# Patient Record
Sex: Female | Born: 1950 | Race: White | Hispanic: No | Marital: Married | Smoking: Never smoker
Health system: Southern US, Community
[De-identification: ages and names within clinical notes are randomized; demographics above are authoritative.]

## PROBLEM LIST (undated history)

## (undated) DIAGNOSIS — M1990 Unspecified osteoarthritis, unspecified site: Secondary | ICD-10-CM

## (undated) DIAGNOSIS — S82891A Other fracture of right lower leg, initial encounter for closed fracture: Secondary | ICD-10-CM

## (undated) DIAGNOSIS — R569 Unspecified convulsions: Secondary | ICD-10-CM

## (undated) DIAGNOSIS — M1711 Unilateral primary osteoarthritis, right knee: Secondary | ICD-10-CM

## (undated) DIAGNOSIS — N6019 Diffuse cystic mastopathy of unspecified breast: Secondary | ICD-10-CM

## (undated) DIAGNOSIS — Z8619 Personal history of other infectious and parasitic diseases: Secondary | ICD-10-CM

## (undated) DIAGNOSIS — S82892A Other fracture of left lower leg, initial encounter for closed fracture: Secondary | ICD-10-CM

## (undated) HISTORY — DX: Unspecified osteoarthritis, unspecified site: M19.90

## (undated) HISTORY — DX: Personal history of other infectious and parasitic diseases: Z86.19

## (undated) HISTORY — PX: DILATION AND CURETTAGE OF UTERUS: SHX78

## (undated) HISTORY — DX: Unilateral primary osteoarthritis, right knee: M17.11

## (undated) HISTORY — DX: Diffuse cystic mastopathy of unspecified breast: N60.19

## (undated) HISTORY — DX: Other fracture of left lower leg, initial encounter for closed fracture: S82.892A

## (undated) HISTORY — DX: Other fracture of right lower leg, initial encounter for closed fracture: S82.891A

---

## 2001-07-27 LAB — HM COLONOSCOPY

## 2003-12-18 ENCOUNTER — Encounter: Payer: Self-pay | Admitting: Physical Medicine and Rehabilitation

## 2004-08-24 ENCOUNTER — Ambulatory Visit: Payer: Self-pay | Admitting: Obstetrics and Gynecology

## 2005-05-22 ENCOUNTER — Ambulatory Visit: Payer: Self-pay | Admitting: Unknown Physician Specialty

## 2005-10-09 ENCOUNTER — Ambulatory Visit: Payer: Self-pay | Admitting: Obstetrics and Gynecology

## 2006-10-30 ENCOUNTER — Ambulatory Visit: Payer: Self-pay | Admitting: Obstetrics and Gynecology

## 2007-01-29 ENCOUNTER — Ambulatory Visit: Payer: Self-pay | Admitting: Obstetrics and Gynecology

## 2007-10-21 ENCOUNTER — Ambulatory Visit: Payer: Self-pay | Admitting: Obstetrics and Gynecology

## 2007-10-21 ENCOUNTER — Other Ambulatory Visit: Payer: Self-pay

## 2007-11-04 ENCOUNTER — Ambulatory Visit: Payer: Self-pay | Admitting: Obstetrics and Gynecology

## 2009-01-11 ENCOUNTER — Other Ambulatory Visit: Payer: Self-pay | Admitting: Obstetrics and Gynecology

## 2009-01-27 ENCOUNTER — Ambulatory Visit: Payer: Self-pay

## 2009-05-26 ENCOUNTER — Other Ambulatory Visit: Payer: Self-pay | Admitting: Obstetrics and Gynecology

## 2010-02-27 ENCOUNTER — Ambulatory Visit: Payer: Self-pay

## 2010-03-01 ENCOUNTER — Ambulatory Visit: Payer: Self-pay

## 2010-10-10 ENCOUNTER — Ambulatory Visit: Payer: Self-pay | Admitting: Family Medicine

## 2011-04-26 ENCOUNTER — Ambulatory Visit: Payer: Self-pay | Admitting: Family Medicine

## 2012-07-27 LAB — HM PAP SMEAR: HM Pap smear: NORMAL

## 2012-07-27 LAB — HM MAMMOGRAPHY: HM Mammogram: NORMAL

## 2012-07-28 ENCOUNTER — Ambulatory Visit: Payer: Self-pay | Admitting: Family Medicine

## 2012-07-30 ENCOUNTER — Ambulatory Visit: Payer: Self-pay | Admitting: Family Medicine

## 2012-07-31 ENCOUNTER — Ambulatory Visit: Payer: Self-pay | Admitting: Family Medicine

## 2013-07-07 ENCOUNTER — Ambulatory Visit: Payer: Self-pay | Admitting: Internal Medicine

## 2013-07-27 ENCOUNTER — Encounter (INDEPENDENT_AMBULATORY_CARE_PROVIDER_SITE_OTHER): Payer: Self-pay

## 2013-07-27 ENCOUNTER — Encounter: Payer: Self-pay | Admitting: Internal Medicine

## 2013-07-27 ENCOUNTER — Ambulatory Visit (INDEPENDENT_AMBULATORY_CARE_PROVIDER_SITE_OTHER): Payer: Medicare PPO | Admitting: Internal Medicine

## 2013-07-27 VITALS — BP 122/88 | HR 69 | Temp 98.4°F | Ht 65.5 in | Wt 179.0 lb

## 2013-07-27 DIAGNOSIS — M949 Disorder of cartilage, unspecified: Secondary | ICD-10-CM

## 2013-07-27 DIAGNOSIS — Z1211 Encounter for screening for malignant neoplasm of colon: Secondary | ICD-10-CM

## 2013-07-27 DIAGNOSIS — M8580 Other specified disorders of bone density and structure, unspecified site: Secondary | ICD-10-CM

## 2013-07-27 DIAGNOSIS — Z1239 Encounter for other screening for malignant neoplasm of breast: Secondary | ICD-10-CM | POA: Insufficient documentation

## 2013-07-27 DIAGNOSIS — M899 Disorder of bone, unspecified: Secondary | ICD-10-CM

## 2013-07-27 DIAGNOSIS — Z0000 Encounter for general adult medical examination without abnormal findings: Secondary | ICD-10-CM

## 2013-07-27 DIAGNOSIS — Z136 Encounter for screening for cardiovascular disorders: Secondary | ICD-10-CM

## 2013-07-27 LAB — COMPREHENSIVE METABOLIC PANEL
ALT: 24 U/L (ref 0–35)
AST: 28 U/L (ref 0–37)
Albumin: 4.3 g/dL (ref 3.5–5.2)
Alkaline Phosphatase: 72 U/L (ref 39–117)
BUN: 11 mg/dL (ref 6–23)
CALCIUM: 9.7 mg/dL (ref 8.4–10.5)
CHLORIDE: 108 meq/L (ref 96–112)
CO2: 27 mEq/L (ref 19–32)
CREATININE: 0.8 mg/dL (ref 0.4–1.2)
GFR: 79.93 mL/min (ref >60.00)
GLUCOSE: 91 mg/dL (ref 70–99)
POTASSIUM: 4.3 meq/L (ref 3.5–5.1)
Sodium: 142 mEq/L (ref 135–145)
Total Bilirubin: 1.8 mg/dL — ABNORMAL HIGH (ref 0.2–1.2)
Total Protein: 6.9 g/dL (ref 6.0–8.3)

## 2013-07-27 LAB — CBC WITH DIFFERENTIAL/PLATELET
BASOS PCT: 0.3 % (ref 0.0–3.0)
Basophils Absolute: 0 10*3/uL (ref 0.0–0.1)
EOS ABS: 0.1 10*3/uL (ref 0.0–0.7)
EOS PCT: 1.4 % (ref 0.0–5.0)
HCT: 42.7 % (ref 36.0–46.0)
HEMOGLOBIN: 14.4 g/dL (ref 12.0–15.0)
Lymphocytes Relative: 24.3 % (ref 12.0–46.0)
Lymphs Abs: 1.4 10*3/uL (ref 0.7–4.0)
MCHC: 33.7 g/dL (ref 30.0–36.0)
MCV: 90.2 fl (ref 78.0–100.0)
Monocytes Absolute: 0.4 10*3/uL (ref 0.1–1.0)
Monocytes Relative: 6.4 % (ref 3.0–12.0)
NEUTROS ABS: 3.8 10*3/uL (ref 1.4–7.7)
NEUTROS PCT: 67.6 % (ref 43.0–77.0)
Platelets: 215 10*3/uL (ref 150.0–400.0)
RBC: 4.74 Mil/uL (ref 3.87–5.11)
RDW: 13.2 % (ref 11.5–15.5)
WBC: 5.7 10*3/uL (ref 4.0–10.5)

## 2013-07-27 LAB — MICROALBUMIN / CREATININE URINE RATIO
Creatinine,U: 111.2 mg/dL
MICROALB/CREAT RATIO: 0.4 mg/g (ref 0.0–30.0)
Microalb, Ur: 0.5 mg/dL (ref 0.0–1.9)

## 2013-07-27 LAB — LIPID PANEL
CHOLESTEROL: 196 mg/dL (ref 0–200)
HDL: 49.6 mg/dL (ref >39.00)
LDL Cholesterol: 117 mg/dL — ABNORMAL HIGH (ref 0–99)
TRIGLYCERIDES: 146 mg/dL (ref 0.0–149.0)
Total CHOL/HDL Ratio: 4
VLDL: 29.2 mg/dL (ref 0.0–40.0)

## 2013-07-27 NOTE — Assessment & Plan Note (Signed)
Pt reports h/o osteopenia. Will check Vit D with labs today. Will set up follow up bone density testing. Discussed importance of adequate dietary calcium intake. Discussed importance of adequate weight bearing exercise.

## 2013-07-27 NOTE — Assessment & Plan Note (Signed)
Will set up mammogram. 

## 2013-07-27 NOTE — Progress Notes (Signed)
Subjective:    Patient ID: Samantha Ellison, female    DOB: December 26, 1950, 63 y.o.   MRN: [id number]  HPI 63YO female presents to establish care. Doing well. Notes some recent issues with right hip pain, however this has completely resolved with chiropractic care. Otherwise, feeling well. Due for mammogram and colonoscopy. Tries to follow healthy diet and exercises regularly.  Review of Systems  Constitutional: Negative for fever, chills, appetite change, fatigue and unexpected weight change.  HENT: Negative for congestion, ear pain, sinus pressure, sore throat, trouble swallowing and voice change.   Eyes: Negative for visual disturbance.  Respiratory: Negative for cough, shortness of breath, wheezing and stridor.   Cardiovascular: Negative for chest pain, palpitations and leg swelling.  Gastrointestinal: Negative for nausea, vomiting, abdominal pain, diarrhea, constipation, blood in stool, abdominal distention and anal bleeding.  Genitourinary: Negative for dysuria and flank pain.  Musculoskeletal: Negative for arthralgias, gait problem, myalgias and neck pain.  Skin: Negative for color change and rash.  Neurological: Negative for dizziness and headaches.  Hematological: Negative for adenopathy. Does not bruise/bleed easily.  Psychiatric/Behavioral: Negative for suicidal ideas, sleep disturbance and dysphoric mood. The patient is not nervous/anxious.        Objective:    BP 122/88  Pulse 69  Temp(Src) 98.4 F (36.9 C) (Oral)  Ht 5' 5.5" (1.664 m)  Wt 179 lb (81.194 kg)  BMI 29.32 kg/m2  SpO2 97% Physical Exam  Constitutional: She is oriented to person, place, and time. She appears well-developed and well-nourished. No distress.  HENT:  Head: Normocephalic and atraumatic.  Right Ear: External ear normal.  Left Ear: External ear normal.  Nose: Nose normal.  Mouth/Throat: Oropharynx is clear and moist. No oropharyngeal exudate.  Eyes: Conjunctivae are normal. Pupils are equal,  round, and reactive to light. Right eye exhibits no discharge. Left eye exhibits no discharge. No scleral icterus.  Neck: Normal range of motion. Neck supple. No tracheal deviation present. No thyromegaly present.  Cardiovascular: Normal rate, regular rhythm, normal heart sounds and intact distal pulses.  Exam reveals no gallop and no friction rub.   No murmur heard. Pulmonary/Chest: Effort normal and breath sounds normal. No accessory muscle usage. Not tachypneic. No respiratory distress. She has no decreased breath sounds. She has no wheezes. She has no rhonchi. She has no rales. She exhibits no tenderness.  Abdominal: Soft. Bowel sounds are normal. She exhibits no distension. There is no tenderness.  Musculoskeletal: Normal range of motion. She exhibits no edema and no tenderness.  Lymphadenopathy:    She has no cervical adenopathy.  Neurological: She is alert and oriented to person, place, and time. No cranial nerve deficit. She exhibits normal muscle tone. Coordination normal.  Skin: Skin is warm and dry. No rash noted. She is not diaphoretic. No erythema. No pallor.  Psychiatric: She has a normal mood and affect. Her behavior is normal. Judgment and thought content normal.          Assessment & Plan:   Problem List Items Addressed This Visit   Osteopenia - Primary     Pt reports h/o osteopenia. Will check Vit D with labs today. Will set up follow up bone density testing. Discussed importance of adequate dietary calcium intake. Discussed importance of adequate weight bearing exercise.    Relevant Orders      DG Bone Density      Vit D  25 hydroxy (rtn osteoporosis monitoring)   Screening for breast cancer     Will set  up mammogram.    Relevant Orders      MM Digital Screening   Special screening for malignant neoplasms, colon     Will set up colonoscopy.    Relevant Orders      Ambulatory referral to Gastroenterology      CBC with Differential      Comprehensive metabolic  panel      Lipid panel      Microalbumin / creatinine urine ratio    Other Visit Diagnoses   Routine general medical examination at a health care facility            Return in about 4 weeks (around 08/24/2013) for Recheck of Blood Pressure, Physical.

## 2013-07-27 NOTE — Progress Notes (Signed)
Pre visit review using our clinic review tool, if applicable. No additional management support is needed unless otherwise documented below in the visit note. 

## 2013-07-27 NOTE — Assessment & Plan Note (Signed)
Will set up colonoscopy. 

## 2013-07-28 ENCOUNTER — Encounter: Payer: Self-pay | Admitting: Internal Medicine

## 2013-07-28 LAB — VITAMIN D 25 HYDROXY (VIT D DEFICIENCY, FRACTURES): Vit D, 25-Hydroxy: 64 ng/mL (ref 30–89)

## 2013-07-28 NOTE — Progress Notes (Signed)
Advised pt and requested labs from previous physician

## 2013-08-07 ENCOUNTER — Telehealth: Payer: Self-pay | Admitting: Internal Medicine

## 2013-08-27 ENCOUNTER — Encounter: Payer: Self-pay | Admitting: Internal Medicine

## 2013-08-27 ENCOUNTER — Ambulatory Visit (INDEPENDENT_AMBULATORY_CARE_PROVIDER_SITE_OTHER): Payer: Medicare PPO | Admitting: Internal Medicine

## 2013-08-27 VITALS — BP 126/84 | HR 75 | Temp 98.2°F | Ht 65.5 in | Wt 182.5 lb

## 2013-08-27 DIAGNOSIS — R17 Unspecified jaundice: Secondary | ICD-10-CM

## 2013-08-27 DIAGNOSIS — Z23 Encounter for immunization: Secondary | ICD-10-CM

## 2013-08-27 DIAGNOSIS — M25559 Pain in unspecified hip: Secondary | ICD-10-CM

## 2013-08-27 DIAGNOSIS — F411 Generalized anxiety disorder: Secondary | ICD-10-CM | POA: Insufficient documentation

## 2013-08-27 DIAGNOSIS — M25551 Pain in right hip: Secondary | ICD-10-CM

## 2013-08-27 DIAGNOSIS — Z0000 Encounter for general adult medical examination without abnormal findings: Secondary | ICD-10-CM | POA: Insufficient documentation

## 2013-08-27 LAB — COMPREHENSIVE METABOLIC PANEL
ALBUMIN: 4.2 g/dL (ref 3.5–5.2)
ALT: 19 U/L (ref 0–35)
AST: 14 U/L (ref 0–37)
Alkaline Phosphatase: 80 U/L (ref 39–117)
BUN: 20 mg/dL (ref 6–23)
CHLORIDE: 106 meq/L (ref 96–112)
CO2: 27 mEq/L (ref 19–32)
CREATININE: 0.7 mg/dL (ref 0.4–1.2)
Calcium: 10 mg/dL (ref 8.4–10.5)
GFR: 86.35 mL/min (ref >60.00)
Glucose, Bld: 78 mg/dL (ref 70–99)
Potassium: 4 mEq/L (ref 3.5–5.1)
SODIUM: 140 meq/L (ref 135–145)
Total Bilirubin: 1.3 mg/dL — ABNORMAL HIGH (ref 0.2–1.2)
Total Protein: 7 g/dL (ref 6.0–8.3)

## 2013-08-27 LAB — HM COLONOSCOPY

## 2013-08-27 MED ORDER — ALPRAZOLAM 0.25 MG PO TABS: 0.2500 mg | ORAL_TABLET | Freq: Every day | ORAL | Status: DC | PRN

## 2013-08-27 NOTE — Progress Notes (Signed)
The patient is here for annual Medicare Wellness Examination and management of other chronic and acute problems.   The risk factors are reflected in the history.  The roster of all physicians providing medical care to patient - is listed in the Snapshot section of the chart.  Activities of daily living:   The patient is 100% independent in all ADLs: dressing, toileting, feeding as well as independent mobility. Patient lives in a free a two story home with husband and 2 cats.  Home safety :  The patient has smoke detectors in the home.  They wear seatbelts in their car. There are no firearms at home.  There is no violence in the home. They feel safe where they live.  Infectious Risks: There is no risks for hepatitis, STDs or HIV.  There is no  history of blood transfusion.  They have no travel history to infectious disease endemic areas of the world.  Additional Health Care Providers: The patient has seen their dentist in the last six months. Dentist - Dr. Juanetta Snow They have seen their eye doctor in the last year. Opthalmologist - [facility] They deny hearing issues. They have deferred audiologic testing in the last year.   They do not  have excessive sun exposure. Discussed the need for sun protection: hats,long sleeves and use of sunscreen if there is significant sun exposure.  Dermatologist - Dr. Roseanne Kaufman  Diet: the importance of a healthy diet is discussed. They do have a healthy diet.  The benefits of regular aerobic exercise were discussed. Patient exercises by walking, recently limited by hip pain. Stays active.  Depression screen: there are no signs or vegative symptoms of depression- irritability, change in appetite, anhedonia, sadness/tearfullness. Occasional anxiety eg with Dental visits.  Cognitive assessment: the patient manages all their financial and personal affairs and is actively engaged. They could relate day,date,year and events.  HCPOA - none in  place  The following portions of the patient's history were reviewed and updated as appropriate: allergies, current medications, past family history, past medical history,  past surgical history, past social history and problem list.  Visual acuity was not assessed per patient preference as they have regular follow up with their ophthalmologist. Hearing and body mass index were assessed and reviewed.   During the course of the visit the patient was educated and counseled about appropriate screening and preventive services including : fall prevention , diabetes screening, nutrition counseling, colorectal cancer screening, and recommended immunizations.    Anxiety - Concerned about anxiety during certain situations, such as at the dentist office and when in a car as a passenger. Would like to consider medication to help with symptoms of anxious mood and panic. Has never taken medication before for this.  Right hip pain - Ongoing for many months. Aching pain made worse by increased activity. Also occasionally has aching pain in right knee. Has been followed by chiropractor with minimal improvement. Not taking any medication on a regular basis for this.  Review of Systems  Constitutional: Negative for fever, chills, appetite change, fatigue and unexpected weight change.  HENT: Negative for congestion, ear pain, sinus pressure, sore throat, trouble swallowing and voice change.   Eyes: Negative for visual disturbance.  Respiratory: Negative for cough, shortness of breath, wheezing and stridor.   Cardiovascular: Negative for chest pain, palpitations and leg swelling.  Gastrointestinal: Negative for nausea, vomiting, abdominal pain, diarrhea, constipation, blood in stool, abdominal distention and anal bleeding.  Genitourinary: Negative for dysuria and flank  pain.  Musculoskeletal: Positive for arthralgias and myalgias. Negative for gait problem and neck pain.  Skin: Negative for color change and rash.   Neurological: Negative for dizziness and headaches.  Hematological: Negative for adenopathy. Does not bruise/bleed easily.  Psychiatric/Behavioral: Negative for suicidal ideas, sleep disturbance and dysphoric mood. The patient is nervous/anxious.        Objective:    BP 126/84  Pulse 75  Temp(Src) 98.2 F (36.8 C) (Oral)  Ht 5' 5.5" (1.664 m)  Wt 182 lb 8 oz (82.781 kg)  BMI 29.90 kg/m2  SpO2 99% Physical Exam  Constitutional: She is oriented to person, place, and time. She appears well-developed and well-nourished. No distress.  HENT:  Head: Normocephalic and atraumatic.  Right Ear: External ear normal.  Left Ear: External ear normal.  Nose: Nose normal.  Mouth/Throat: Oropharynx is clear and moist. No oropharyngeal exudate.  Eyes: Conjunctivae are normal. Pupils are equal, round, and reactive to light. Right eye exhibits no discharge. Left eye exhibits no discharge. No scleral icterus.  Neck: Normal range of motion. Neck supple. No tracheal deviation present. No thyromegaly present.  Cardiovascular: Normal rate, regular rhythm, normal heart sounds and intact distal pulses.  Exam reveals no gallop and no friction rub.   No murmur heard. Pulmonary/Chest: Effort normal and breath sounds normal. No accessory muscle usage. Not tachypneic. No respiratory distress. She has no decreased breath sounds. She has no wheezes. She has no rhonchi. She has no rales. She exhibits no tenderness. Right breast exhibits no inverted nipple, no mass, no nipple discharge, no skin change and no tenderness. Left breast exhibits no inverted nipple, no mass, no nipple discharge, no skin change and no tenderness. Breasts are symmetrical.  Abdominal: Soft. Bowel sounds are normal. She exhibits no distension and no mass. There is no tenderness. There is no rebound and no guarding.  Musculoskeletal: Normal range of motion. She exhibits no edema and no tenderness.       Right hip: She exhibits normal range of  motion and normal strength.       Right knee: She exhibits swelling (focal swelling right medial knee. non-tender to palpation. no induration). She exhibits normal range of motion and no effusion.  Lymphadenopathy:    She has no cervical adenopathy.  Neurological: She is alert and oriented to person, place, and time. No cranial nerve deficit. She exhibits normal muscle tone. Coordination normal.  Skin: Skin is warm and dry. No rash noted. She is not diaphoretic. No erythema. No pallor.  Psychiatric: She has a normal mood and affect. Her behavior is normal. Judgment and thought content normal.          Assessment & Plan:   Problem List Items Addressed This Visit     Unprioritized   Anxiety state, unspecified     Symptoms of anxiety, specifically claustrophobia. Will start Alprazolam 0.25mg  daily prn for severe anxiety. Discussed potential side effects of this medication including sedation. Follow up prn.    Relevant Medications      ALPRAZolam  (XANAX) tablet   Elevated bilirubin     Repeat bilirubin improved today. 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 Will plan to repeat bilirubin in 3-6 months.    Relevant Orders      Comp Met (CMET) (Completed)   Medicare annual wellness visit, initial - Primary     General medical exam normal today including breast exam. PAP and pelvic deferred as PAP normal 2014. Mammogram scheduled. Colonoscopy scheduled. Immunizations UTD except for Prevnar  which was given today. Encouraged healthy diet and exercise. Reviewed recent labs.EKG normal today.    Relevant Orders      Ambulatory referral to Sports Medicine      EKG 12-Lead (Completed)   Right hip pain     Right hip pain, chronic. Likely secondary to OA. Will set up sports medicine evaluatoin. .    Relevant Orders      Ambulatory referral to Sports Medicine    Other Visit Diagnoses   Need for vaccination with 13-polyvalent pneumococcal conjugate vaccine        Relevant Orders        Pneumococcal conjugate vaccine 13-valent (Completed)        Return in about 1 year (around 08/28/2014) for Wellness Visit.

## 2013-08-27 NOTE — Assessment & Plan Note (Addendum)
Symptoms of anxiety, specifically claustrophobia. Will start Alprazolam 0.25mg  daily prn for severe anxiety. Discussed potential side effects of this medication including sedation. Follow up prn.

## 2013-08-27 NOTE — Assessment & Plan Note (Signed)
Repeat bilirubin improved today. 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 Will plan to repeat bilirubin in 3-6 months.

## 2013-08-27 NOTE — Progress Notes (Signed)
Pre visit review using our clinic review tool, if applicable. No additional management support is needed unless otherwise documented below in the visit note. 

## 2013-08-27 NOTE — Assessment & Plan Note (Addendum)
Right hip pain, chronic. Likely secondary to OA. Will set up sports medicine evaluatoin. Marland Kitchen

## 2013-08-27 NOTE — Assessment & Plan Note (Signed)
General medical exam normal today including breast exam. PAP and pelvic deferred as PAP normal 2014. Mammogram scheduled. Colonoscopy scheduled. Immunizations UTD except for Prevnar which was given today. Encouraged healthy diet and exercise. Reviewed recent labs.EKG normal today.

## 2013-09-04 ENCOUNTER — Encounter: Payer: Self-pay | Admitting: Family Medicine

## 2013-09-04 ENCOUNTER — Ambulatory Visit (INDEPENDENT_AMBULATORY_CARE_PROVIDER_SITE_OTHER): Payer: Medicare PPO | Admitting: Family Medicine

## 2013-09-04 ENCOUNTER — Ambulatory Visit (INDEPENDENT_AMBULATORY_CARE_PROVIDER_SITE_OTHER)
Admission: RE | Admit: 2013-09-04 | Discharge: 2013-09-04 | Disposition: A | Discharge status: Home / Self Care | Payer: Medicare PPO | Source: Ambulatory Visit | Attending: Family Medicine | Admitting: Family Medicine

## 2013-09-04 VITALS — BP 142/88 | HR 76 | Ht 65.0 in | Wt 183.0 lb

## 2013-09-04 DIAGNOSIS — M25551 Pain in right hip: Secondary | ICD-10-CM

## 2013-09-04 DIAGNOSIS — M25559 Pain in unspecified hip: Secondary | ICD-10-CM

## 2013-09-04 DIAGNOSIS — M25561 Pain in right knee: Secondary | ICD-10-CM

## 2013-09-04 DIAGNOSIS — M25569 Pain in unspecified knee: Secondary | ICD-10-CM

## 2013-09-04 NOTE — Assessment & Plan Note (Signed)
Patient ID patient is also likely secondary to arthritis. X-rays were ordered today. We discussed the possibility of bracing which patient declined. Patient was given home exercise program as well as icing. Patient is to make any significant improvement we'll try more aggressive therapy to followup in 3 weeks

## 2013-09-04 NOTE — Assessment & Plan Note (Signed)
Patient's right hip pain is likely osteoarthritis. I would like to get x-ray to make sure the quantity in severity. We discussed some exercises, over-the-counter medications and was given a prescription for meloxicam. We discussed an icing protocol as well as well as proper shoe choices. Patient will try this conservative approach and is continuing to have pain in 2-3 weeks she'll come back and we will consider doing an intra-articular injection.

## 2013-09-04 NOTE — Patient Instructions (Signed)
Good to meet you Xrays downstairs today.  Exercises 3 times a week.  Take tylenol 650 mg three times a day is the best evidence based medicine we have for arthritis. Even 1 pill 3 times a day can help. Glucosamine sulfate 750mg  twice a day is a supplement that has been shown to help moderate to severe arthritis. Vitamin D 2000 IU daily Fish oil 2 grams daily.  Tumeric 500mg  twice daily.  Capsaicin topically up to four times a day may also help with pain. Gilbert's Disease.  It's important that you continue to stay active. Controlling your weight is important.  Consider physical therapy to strengthen muscles around the joint that hurts to take pressure off of the joint itself. Shoe inserts with good arch support may be helpful.  [employer] [facility] [employer] could help.  Water aerobics and cycling with low resistance are the best two types of exercise for arthritis. Come back and see me in 3 weeks.

## 2013-09-04 NOTE — Progress Notes (Signed)
Tawana ScaleZach Altic D.O. [facility] [address] [place], [place] [address] Phone: [phone] Subjective:    I'm seeing this patient by the request  of:  Wynona DoveWALKER,JENNIFER AZBELL, MD   CC: Right hip and knee pain  OZH:[id number]:Subjective Samantha LazarShelley Arya is a [age] y.o. female coming in with complaint of right hip and knee pain. Patient states that she has had this pain for multiple months. Patient states that the hip hurt before the knee.  Patient describes some right hip pain is mostly groin pain it seems to be associated only with activity. Patient states that it hurts more when she walks or stands for a long amount of time. Patient describes it as a dull aching sensation without any significant radiation. Patient states that the back of her hip can hurt as well. Denies any pain on the lateral aspect of the hip. Patient denies any nighttime awakening and denies any fevers or chills. Patient has taken Aleve with moderate improvement. Patient was the severity of 8/10 when it is very bad. Patient has 0/10 pain when sitting. Patient is also seen a chiropractor with minimal benefit.  Patient's knee pain seems to be only the last couple months. Denies any injury to the knee. Patient states she's been walking differently secondary to her hip pain. Patient thinks that this could be contributing. Denies any swelling, any radiation, any numbness. Patient is still able to do all activities of daily living and denies any nighttime awakening. Once again Aleve does seem to help.     Past medical history, social, surgical and family history all reviewed in electronic medical record.   Review of Systems: No headache, visual changes, nausea, vomiting, diarrhea, constipation, dizziness, abdominal pain, skin rash, fevers, chills, night sweats, weight loss, swollen lymph nodes, body aches, joint swelling, muscle aches, chest pain, shortness of breath, mood changes.   Objective Blood pressure 142/88, pulse 76, height  5\' 5"  (1.651 m), SpO2 99.00%.  General: No apparent distress alert and oriented x3 mood and affect normal, dressed appropriately.  HEENT: Pupils equal, extraocular movements intact  Respiratory: Patient's speak in full sentences and does not appear short of breath  Cardiovascular: No lower extremity edema, non tender, no erythema  Skin: Warm dry intact with no signs of infection or rash on extremities or on axial skeleton.  Abdomen: Soft nontender  Neuro: Cranial nerves II through XII are intact, neurovascularly intact in all extremities with 2+ DTRs and 2+ pulses.  Lymph: No lymphadenopathy of posterior or anterior cervical chain or axillae bilaterally.  Gait normal with good balance and coordination.  MSK:  Non tender with full range of motion and good stability and symmetric strength and tone of shoulders, elbows, wrist, and ankles bilaterally.  Hip: ROM IR: 15 Deg with pain, ER: 45 Deg, Flexion: 120 Deg, Extension: 100 Deg, Abduction: 45 Deg, Adduction: 45 Deg Strength IR: 5/5, ER: 5/5, Flexion: 5/5, Extension: 5/5, Abduction: 4/5, Adduction: 4/5 Pelvic alignment unremarkable to inspection and palpation. Standing hip rotation and gait without trendelenburg sign / unsteadiness. Greater trochanter without tenderness to palpation. No tenderness over piriformis and greater trochanter. Positive FADIR No SI joint tenderness and normal minimal SI movement. Contralateral hip unremarkable  Knee: Normal to inspection with no erythema or effusion or obvious bony abnormalities. Palpation reveals mild tenderness to palpation ROM full in flexion and extension and lower leg rotation. Ligaments with solid consistent endpoints including ACL, PCL, LCL, MCL. Negative Mcmurray's, Apley's, and Thessalonian tests. painful patellar compression. Patellar glide with  moderate crepitus. Patellar and quadriceps tendons unremarkable. Hamstring and quadriceps strength is normal.     Impression and  Recommendations:     This case required medical decision making of moderate complexity.

## 2013-09-16 ENCOUNTER — Encounter: Payer: Self-pay | Admitting: *Deleted

## 2013-09-16 ENCOUNTER — Ambulatory Visit: Payer: Self-pay | Admitting: Internal Medicine

## 2013-09-16 LAB — HM MAMMOGRAPHY: HM Mammogram: NEGATIVE

## 2013-09-16 LAB — HM DEXA SCAN

## 2013-09-17 ENCOUNTER — Telehealth: Payer: Self-pay | Admitting: Internal Medicine

## 2013-09-17 ENCOUNTER — Encounter: Payer: Self-pay | Admitting: *Deleted

## 2013-09-17 NOTE — Telephone Encounter (Signed)
Bone density from 7/1 showed osteopenia with T-score -2.1. Can we set up a follow up to discuss options for treatment? Thanks

## 2013-09-21 NOTE — Telephone Encounter (Signed)
Left vm requesting pt to return my call 

## 2013-09-24 ENCOUNTER — Encounter: Payer: Self-pay | Admitting: Family Medicine

## 2013-09-24 ENCOUNTER — Ambulatory Visit (INDEPENDENT_AMBULATORY_CARE_PROVIDER_SITE_OTHER): Payer: Medicare PPO | Admitting: Family Medicine

## 2013-09-24 VITALS — BP 120/72 | HR 77 | Ht 65.0 in | Wt 185.0 lb

## 2013-09-24 DIAGNOSIS — M25559 Pain in unspecified hip: Secondary | ICD-10-CM

## 2013-09-24 DIAGNOSIS — M25551 Pain in right hip: Secondary | ICD-10-CM

## 2013-09-24 NOTE — Telephone Encounter (Signed)
Notified pt, she will call the office tomorrow to schedule an appt

## 2013-09-24 NOTE — Patient Instructions (Signed)
Good to see you You are doing great and need to give a pat on the back Continue the medications Lelon FrohlichKeen, Merrell, Dansko and New balance >700  Continue the exercise 3 times a week.  Ice still is your friend.  Come back in 6 weeks and if still in pain we can do injection.

## 2013-09-24 NOTE — Progress Notes (Signed)
  Tawana ScaleZach Reddington D.O. [facility] [address] [place], [place] [address] Phone: [phone] Subjective:     CC: Right hip and knee pain followup  BJY:[id number]:Subjective Samantha SettlerShelley W Ellison is a 63 y.o. female coming in with complaint of right hip and right knee pain. Patient states that the knee is almost completely resolved at this time. Patient is feeling much better.  Patient states that her hip being the more of her concern has also improved. Patient states that she is about 80% better. Patient did have x-rays at last visit do show that she did have moderate osteoarthritic changes. Patient states that overall she is able to do more activities through the day and has a little bit more energy but still at the end of the day she can have a dull aching pain mostly in the groin area. Patient states that she has tried to do the exercises as well as the over-the-counter medications we discussed and has made some improvement. Patient denies any nighttime pain while sleeping that wakes her up. Overall she is fairly happy with the results. Patient is also doing yoga and has noticed some increasing her range of motion.     Past medical history, social, surgical and family history all reviewed in electronic medical record.   Review of Systems: No headache, visual changes, nausea, vomiting, diarrhea, constipation, dizziness, abdominal pain, skin rash, fevers, chills, night sweats, weight loss, swollen lymph nodes, body aches, joint swelling, muscle aches, chest pain, shortness of breath, mood changes.   Objective Blood pressure 120/72, pulse 77, height 5\' 5"  (1.651 m), weight 185 lb (83.915 kg), SpO2 97.00%.  General: No apparent distress alert and oriented x3 mood and affect normal, dressed appropriately.  HEENT: Pupils equal, extraocular movements intact  Respiratory: Patient's speak in full sentences and does not appear short of breath  Cardiovascular: No lower extremity edema, non tender, no  erythema  Skin: Warm dry intact with no signs of infection or rash on extremities or on axial skeleton.  Abdomen: Soft nontender  Neuro: Cranial nerves II through XII are intact, neurovascularly intact in all extremities with 2+ DTRs and 2+ pulses.  Lymph: No lymphadenopathy of posterior or anterior cervical chain or axillae bilaterally.  Gait normal with good balance and coordination.  MSK:  Non tender with full range of motion and good stability and symmetric strength and tone of shoulders, elbows, wrist, and ankles bilaterally.  Hip: ROM IR: 20 Deg but still with pain, ER: 45 Deg, Flexion: 120 Deg, Extension: 100 Deg, Abduction: 45 Deg, Adduction: 45 Deg Strength IR: 5/5, ER: 5/5, Flexion: 5/5, Extension: 5/5, Abduction: 4/5, Adduction: 4/5 Pelvic alignment unremarkable to inspection and palpation. Standing hip rotation and gait without trendelenburg sign / unsteadiness. Greater trochanter without tenderness to palpation. No tenderness over piriformis and greater trochanter. Positive FADIR No SI joint tenderness and normal minimal SI movement. Contralateral hip unremarkable      Impression and Recommendations:     This case required medical decision making of moderate complexity.

## 2013-09-24 NOTE — Assessment & Plan Note (Signed)
Patient does have moderate osteophytic changes but is 80% better with conservative therapy. We encourage her to continue with the same regimen at this time. We also discussed shoes and given her more strengthening exercises. We discussed in some walking biking could be more beneficial in less stress on her hip. We also discussed that loss of weight to be helpful. Patient will continue with this as well as changes shoes that I think would be helpful and come back and see us again in 4-6 weeks. Continuing to have pain or worsening pain we can notice consider an intra-articular injection.  Spent greater than 25 minutes with patient face-to-face and had greater than 50% of counseling including as described above in assessment and plan.

## 2013-09-25 ENCOUNTER — Encounter: Payer: Self-pay | Admitting: Internal Medicine

## 2013-09-25 ENCOUNTER — Ambulatory Visit (INDEPENDENT_AMBULATORY_CARE_PROVIDER_SITE_OTHER): Payer: Medicare PPO | Admitting: Internal Medicine

## 2013-09-25 VITALS — BP 112/78 | HR 80 | Temp 98.2°F | Ht 65.5 in | Wt 184.8 lb

## 2013-09-25 DIAGNOSIS — M8580 Other specified disorders of bone density and structure, unspecified site: Secondary | ICD-10-CM

## 2013-09-25 DIAGNOSIS — M949 Disorder of cartilage, unspecified: Secondary | ICD-10-CM

## 2013-09-25 DIAGNOSIS — M899 Disorder of bone, unspecified: Secondary | ICD-10-CM

## 2013-09-25 DIAGNOSIS — M25559 Pain in unspecified hip: Secondary | ICD-10-CM

## 2013-09-25 DIAGNOSIS — M25551 Pain in right hip: Secondary | ICD-10-CM

## 2013-09-25 NOTE — Assessment & Plan Note (Signed)
Reviewed recent notes from Dr. Katrinka BlazingSmith. Pt plans to continue conservative measures, but may call Dr. Katrinka BlazingSmith to schedule a steroid injection if symptoms persistent.

## 2013-09-25 NOTE — Progress Notes (Signed)
Subjective:    Patient ID: Samantha Ellison, female    DOB: July 15, 1950, 63 y.o.   MRN: [id number]  HPI 63YO female presents to follow up bone density testing.  Bone density test 7/1 showed osteopenia with T-score of -2.1. She has never taken medication for bone density in the past. She was recently instructed to take calcium and vit d by her sports medicine physician.  Right hip pain - She recently had evaluation with Dr. Katrinka Ellison. Has had some improvement in pain with conservative measures. Considering steroid injection in the future. Currently taking prn tylenol with good control of aching pain.  Review of Systems  Constitutional: Negative for fever, chills, appetite change, fatigue and unexpected weight change.  Eyes: Negative for visual disturbance.  Respiratory: Negative for shortness of breath.   Cardiovascular: Negative for chest pain and leg swelling.  Gastrointestinal: Negative for abdominal pain.  Musculoskeletal: Positive for arthralgias (right hip).  Skin: Negative for color change and rash.  Hematological: Negative for adenopathy. Does not bruise/bleed easily.  Psychiatric/Behavioral: Negative for dysphoric mood. The patient is not nervous/anxious.        Objective:    BP 112/78  Pulse 80  Temp(Src) 98.2 F (36.8 C) (Oral)  Ht 5' 5.5" (1.664 m)  Wt 184 lb 12 oz (83.802 kg)  BMI 30.27 kg/m2  SpO2 97% Physical Exam  Constitutional: She is oriented to person, place, and time. She appears well-developed and well-nourished. No distress.  HENT:  Head: Normocephalic and atraumatic.  Right Ear: External ear normal.  Left Ear: External ear normal.  Nose: Nose normal.  Mouth/Throat: Oropharynx is clear and moist.  Eyes: Conjunctivae are normal. Pupils are equal, round, and reactive to light. Right eye exhibits no discharge. Left eye exhibits no discharge. No scleral icterus.  Neck: Normal range of motion. Neck supple. No tracheal deviation present. No thyromegaly present.    Cardiovascular: Normal rate, regular rhythm, normal heart sounds and intact distal pulses.  Exam reveals no gallop and no friction rub.   No murmur heard. Pulmonary/Chest: Effort normal and breath sounds normal. No accessory muscle usage. Not tachypneic. No respiratory distress. She has no decreased breath sounds. She has no wheezes. She has no rhonchi. She has no rales. She exhibits no tenderness.  Musculoskeletal: Normal range of motion. She exhibits no edema and no tenderness.  Lymphadenopathy:    She has no cervical adenopathy.  Neurological: She is alert and oriented to person, place, and time. No cranial nerve deficit. She exhibits normal muscle tone. Coordination normal.  Skin: Skin is warm and dry. No rash noted. She is not diaphoretic. No erythema. No pallor.  Psychiatric: She has a normal mood and affect. Her behavior is normal. Judgment and thought content normal.          Assessment & Plan:  Over of which >50% spent in face-to-face contact with patient discussing plan of care  Problem List Items Addressed This Visit     Unprioritized   Osteopenia - Primary     Recent Bone density testing shows T-score -2.1. We discussed pros and cons of medications to help strengthen bones such as bisphosphonates. We discussed recent information about risks of calcium supplementation and guideline change to recommend dietary calcium as main source. We also reviewed recent Vit D level of 64 and discussed continuing Vit D 2000units daily. We discussed importance of weight bearing activity. She would prefer to continue conservative efforts and hold off on additional medications at this time. Plan  repeat bone density in 2017.    Right hip pain     Reviewed recent notes from Dr. Katrinka Ellison. Pt plans to continue conservative measures, but may call Dr. Katrinka Ellison to schedule a steroid injection if symptoms persistent.        Return in about 6 months (around 03/28/2014) for Recheck.

## 2013-09-25 NOTE — Assessment & Plan Note (Signed)
Recent Bone density testing shows T-score -2.1. We discussed pros and cons of medications to help strengthen bones such as bisphosphonates. We discussed recent information about risks of calcium supplementation and guideline change to recommend dietary calcium as main source. We also reviewed recent Vit D level of 64 and discussed continuing Vit D 2000units daily. We discussed importance of weight bearing activity. She would prefer to continue conservative efforts and hold off on additional medications at this time. Plan repeat bone density in 2017.

## 2013-09-25 NOTE — Patient Instructions (Signed)
Increase weight bearing exercise.  Continue VIt D 2000units daily.  Plan for repeat bone density in 2017.

## 2013-09-25 NOTE — Progress Notes (Signed)
Pre visit review using our clinic review tool, if applicable. No additional management support is needed unless otherwise documented below in the visit note. 

## 2013-11-09 ENCOUNTER — Other Ambulatory Visit (INDEPENDENT_AMBULATORY_CARE_PROVIDER_SITE_OTHER): Payer: Medicare PPO

## 2013-11-09 ENCOUNTER — Encounter: Payer: Self-pay | Admitting: Family Medicine

## 2013-11-09 ENCOUNTER — Ambulatory Visit (INDEPENDENT_AMBULATORY_CARE_PROVIDER_SITE_OTHER): Payer: Medicare PPO | Admitting: Family Medicine

## 2013-11-09 VITALS — BP 144/88 | HR 72 | Ht 65.5 in | Wt 185.0 lb

## 2013-11-09 DIAGNOSIS — M25551 Pain in right hip: Secondary | ICD-10-CM

## 2013-11-09 DIAGNOSIS — M25559 Pain in unspecified hip: Secondary | ICD-10-CM

## 2013-11-09 DIAGNOSIS — M1611 Unilateral primary osteoarthritis, right hip: Secondary | ICD-10-CM

## 2013-11-09 DIAGNOSIS — M1610 Unilateral primary osteoarthritis, unspecified hip: Secondary | ICD-10-CM

## 2013-11-09 NOTE — Assessment & Plan Note (Signed)
The patient did have injection today. Patient did have some mild muscle spasms of the hip flexor after the injection but did resolve before patient left. We discussed redness flags and went to seek medical attention at that did occur. Patient is verbalized understanding. Discussed icing regimen and continue with the home exercises. Patient was given a trial of topical anti-inflammatories. Patient will continue the over-the-counter medications. Patient will come back again in 3-4 weeks for further evaluation and treatment.  Spent greater than 25 minutes with patient face-to-face and had greater than 50% of counseling including as described above in assessment and plan.

## 2013-11-09 NOTE — Patient Instructions (Signed)
It is good to see you.  Ice will always be your friend. Especially in 6 hours.  Continue the exercises.  Can't wait for you to join a gym.  Continue good shoes.  If anything changes please call me

## 2013-11-09 NOTE — Progress Notes (Signed)
[facility] [address] [place], [place] [address] Phone: [phone] Subjective:     CC: Right hip pain followup  OZH:YQMVHQIONG TYYONNA SOUCY is a 63 y.o. female coming in with complaint of right hip pain. Patient's right knee did do well with injection previously. Patient unfortunately continued to have right hip pain. 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 Patient was to do home exercises, icing protocol, change shoes.  Patient states she has been 15-20% improvement.  Patient continues to have some difficulty. Patient has not been able to do regular activities though do to having family in town so has not been doing exercises regularly. Still having some mild discomfort at night. Patient has not been working out regularly.     Past medical history, social, surgical and family history all reviewed in electronic medical record.   Review of Systems: No headache, visual changes, nausea, vomiting, diarrhea, constipation, dizziness, abdominal pain, skin rash, fevers, chills, night sweats, weight loss, swollen lymph nodes, body aches, joint swelling, muscle aches, chest pain, shortness of breath, mood changes.   Objective Blood pressure 144/88, pulse 72, height 5' 5.5" (1.664 m), weight 185 lb (83.915 kg), SpO2 97.00%.  General: No apparent distress alert and oriented x3 mood and affect normal, dressed appropriately.  HEENT: Pupils equal, extraocular movements intact  Respiratory: Patient's speak in full sentences and does not appear short of breath  Cardiovascular: No lower extremity edema, non tender, no erythema  Skin: Warm dry intact with no signs of infection or rash on extremities or on axial skeleton.  Abdomen: Soft nontender  Neuro: Cranial nerves II through XII are intact, neurovascularly intact in all extremities with 2+ DTRs and 2+ pulses.  Lymph: No lymphadenopathy of posterior or anterior cervical chain or  axillae bilaterally.  Gait normal with good balance and coordination.  MSK:  Non tender with full range of motion and good stability and symmetric strength and tone of shoulders, elbows, wrist, and ankles bilaterally.  Hip: ROM IR: 20 Deg but still with pain, ER: 45 Deg, Flexion: 110 Deg, Extension: 100 Deg, Abduction: 45 Deg, Adduction: 45 Deg Strength IR: 5/5, ER: 5/5, Flexion: 5/5, Extension: 5/5, Abduction: 4/5, Adduction: 4/5 Pelvic alignment unremarkable to inspection and palpation. Standing hip rotation and gait without trendelenburg sign / unsteadiness. Greater trochanter without tenderness to palpation. No tenderness over piriformis and greater trochanter. Positive FADIR No SI joint tenderness and normal minimal SI movement. Contralateral hip unremarkable  Procedure: Real-time Ultrasound Guided Injection of right hip Device: GE Logiq E  Ultrasound guided injection is preferred based studies that show increased duration, increased effect, greater accuracy, decreased procedural pain, increased response rate with ultrasound guided versus blind injection.  Verbal informed consent obtained.  Time-out conducted.  Noted no overlying erythema, induration, or other signs of local infection.  Skin prepped in a sterile fashion.  Local anesthesia: Topical Ethyl chloride.  With sterile technique and under real time ultrasound guidance:  Anterior capsule visualized, needle visualized going to the head neck junction at the anterior capsule. Pictures taken. Patient did have injection of 3 cc of 1% lidocaine, 3 cc of 0.5% Marcaine, and 1 cc of Kenalog 40 mg/dL. Completed without difficulty  Pain immediately resolved suggesting accurate placement of the medication.  Advised to call if fevers/chills, erythema, induration, drainage, or persistent bleeding.  Images permanently stored and available for review in the ultrasound unit.  Impression: Technically successful ultrasound guided  injection.  Impression and Recommendations:     This case required medical decision making of moderate complexity.

## 2013-11-17 ENCOUNTER — Telehealth: Payer: Self-pay | Admitting: Internal Medicine

## 2013-11-17 MED ORDER — DICLOFENAC SODIUM 2 % TD SOLN: TRANSDERMAL | Status: DC

## 2013-11-17 NOTE — Telephone Encounter (Signed)
Pt called in and said that Dr Katrinka Blazing  gave her instruction to call back if the sample of [place] did help.  She said it helpped and would like for him to call some in for her.  Best number is [phone]   Thank you!!

## 2013-11-17 NOTE — Telephone Encounter (Signed)
Refill done.  

## 2013-12-14 ENCOUNTER — Encounter: Payer: Self-pay | Admitting: Family Medicine

## 2013-12-14 ENCOUNTER — Ambulatory Visit (INDEPENDENT_AMBULATORY_CARE_PROVIDER_SITE_OTHER): Payer: Medicare PPO | Admitting: Family Medicine

## 2013-12-14 VITALS — BP 132/84 | HR 66 | Ht 65.0 in | Wt 185.0 lb

## 2013-12-14 DIAGNOSIS — M1610 Unilateral primary osteoarthritis, unspecified hip: Secondary | ICD-10-CM

## 2013-12-14 DIAGNOSIS — M1611 Unilateral primary osteoarthritis, right hip: Secondary | ICD-10-CM

## 2013-12-14 NOTE — Assessment & Plan Note (Signed)
Discussed with patient at this time. We can repeat the corticosteroid injections every 3-4 months if necessary. We discussed continuing the conservative therapy with her doing so well. At some point patient likely will need a hip replacement. Patient states though at this time there would be no reason for her. We'll continue to monitor closely and patient will followup in 2-3 months for further evaluation and treatment.

## 2013-12-14 NOTE — Patient Instructions (Signed)
Good to see you.  Continue the over the counter medicines,  Ice is you friend You are doing great!  Give yourself a pat on the back.  Continue the swimming and bike.  If you do the class avoid squatting greater then 45 degrees running or jumping.  See me when you ndde me.  We can repeat injection in December if needed.

## 2013-12-14 NOTE — Progress Notes (Signed)
  [facility] [address] [place], [place] [address] Phone: [phone] Subjective:     CC: Right hip pain followup  Samantha Ellison Samantha Ellison is a 63 y.o. female coming in with complaint of right hip pain. Patient's right knee did do well with injection previously. Patient unfortunately continued to have right hip pain. 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 Patient had been doing conservative therapy in one month ago and did have an injection under ultrasound guidance and the hip. Patient states overall she is approximately 40% better. Patient states that she can do significantly more activity at this time. Patient states that she's able to do activities of daily living and is resting comfortably at night. Patient continues all the conservative therapy including over-the-counter medicines, home exercises, icing, and has increased her biking. Overall patient is fairly happy with the results.     Past medical history, social, surgical and family history all reviewed in electronic medical record.   Review of Systems: No headache, visual changes, nausea, vomiting, diarrhea, constipation, dizziness, abdominal pain, skin rash, fevers, chills, night sweats, weight loss, swollen lymph nodes, body aches, joint swelling, muscle aches, chest pain, shortness of breath, mood changes.   Objective Blood pressure 132/84, pulse 66, height  (1.651 m), weight 185 lb (83.915 kg), SpO2 99.00%.  General: No apparent distress alert and oriented x3 mood and affect normal, dressed appropriately.  HEENT: Pupils equal, extraocular movements intact  Respiratory: Patient's speak in full sentences and does not appear short of breath  Cardiovascular: No lower extremity edema, non tender, no erythema  Skin: Warm dry intact with no signs of infection or rash on extremities or on axial skeleton.  Abdomen: Soft nontender  Neuro: Cranial nerves II  through XII are intact, neurovascularly intact in all extremities with 2+ DTRs and 2+ pulses.  Lymph: No lymphadenopathy of posterior or anterior cervical chain or axillae bilaterally.  Gait normal with good balance and coordination.  MSK:  Non tender with full range of motion and good stability and symmetric strength and tone of shoulders, elbows, wrist, and ankles bilaterally.  Hip: ROM IR: 20 Deg but significant less pain, ER: 45 Deg, Flexion: 110 Deg, Extension: 100 Deg, Abduction: 45 Deg, Adduction: 45 Deg Strength IR: 5/5, ER: 5/5, Flexion: 5/5, Extension: 5/5, Abduction: 4/5, Adduction: 4/5 Pelvic alignment unremarkable to inspection and palpation. Standing hip rotation and gait without trendelenburg sign / unsteadiness. Greater trochanter without tenderness to palpation. No tenderness over piriformis and greater trochanter. Positive FADIR still No SI joint tenderness and normal minimal SI movement. Contralateral hip unremarkable    Impression and Recommendations:     This case required medical decision making of moderate complexity.

## 2014-02-08 ENCOUNTER — Telehealth: Payer: Self-pay

## 2014-02-08 NOTE — Telephone Encounter (Signed)
The patient called and is under the impression she is due for a follow up billirubin test.  Do you want this ordered and scheduled for her?   Callback-  [phone]

## 2014-02-08 NOTE — Telephone Encounter (Signed)
Sure. CMP for elevated LFTs.

## 2014-02-09 ENCOUNTER — Other Ambulatory Visit: Payer: Self-pay | Admitting: *Deleted

## 2014-02-09 DIAGNOSIS — R7989 Other specified abnormal findings of blood chemistry: Secondary | ICD-10-CM

## 2014-02-09 DIAGNOSIS — R945 Abnormal results of liver function studies: Principal | ICD-10-CM

## 2014-02-09 NOTE — Telephone Encounter (Signed)
Lab ordered, needs apt to be scheduled

## 2014-02-10 ENCOUNTER — Telehealth: Payer: Self-pay | Admitting: *Deleted

## 2014-02-10 NOTE — Telephone Encounter (Signed)
Pt is coming in Monday what labs and dx? 

## 2014-02-11 NOTE — Telephone Encounter (Signed)
CMP for elevated bilirubin

## 2014-02-15 ENCOUNTER — Other Ambulatory Visit (INDEPENDENT_AMBULATORY_CARE_PROVIDER_SITE_OTHER): Payer: Medicare PPO

## 2014-02-15 DIAGNOSIS — R945 Abnormal results of liver function studies: Principal | ICD-10-CM

## 2014-02-15 DIAGNOSIS — R7989 Other specified abnormal findings of blood chemistry: Secondary | ICD-10-CM

## 2014-02-15 LAB — COMPREHENSIVE METABOLIC PANEL
ALT: 27 U/L (ref 0–35)
AST: 24 U/L (ref 0–37)
Albumin: 4.3 g/dL (ref 3.5–5.2)
Alkaline Phosphatase: 104 U/L (ref 39–117)
BUN: 20 mg/dL (ref 6–23)
CALCIUM: 9.7 mg/dL (ref 8.4–10.5)
CHLORIDE: 101 meq/L (ref 96–112)
CO2: 25 mEq/L (ref 19–32)
CREATININE: 0.8 mg/dL (ref 0.4–1.2)
GFR: 75.27 mL/min (ref >60.00)
Glucose, Bld: 58 mg/dL — ABNORMAL LOW (ref 70–99)
Potassium: 4.5 mEq/L (ref 3.5–5.1)
SODIUM: 137 meq/L (ref 135–145)
TOTAL PROTEIN: 6.5 g/dL (ref 6.0–8.3)
Total Bilirubin: 0.9 mg/dL (ref 0.2–1.2)

## 2014-02-15 NOTE — Telephone Encounter (Signed)
Please see below.

## 2014-03-15 ENCOUNTER — Ambulatory Visit: Payer: Medicare PPO | Admitting: Family Medicine

## 2014-03-17 ENCOUNTER — Encounter: Payer: Self-pay | Admitting: Family Medicine

## 2014-03-17 ENCOUNTER — Other Ambulatory Visit (INDEPENDENT_AMBULATORY_CARE_PROVIDER_SITE_OTHER): Payer: Medicare PPO

## 2014-03-17 ENCOUNTER — Ambulatory Visit (INDEPENDENT_AMBULATORY_CARE_PROVIDER_SITE_OTHER): Payer: Medicare PPO | Admitting: Family Medicine

## 2014-03-17 VITALS — BP 138/84 | HR 80 | Ht 65.0 in | Wt 193.0 lb

## 2014-03-17 DIAGNOSIS — M25551 Pain in right hip: Secondary | ICD-10-CM

## 2014-03-17 DIAGNOSIS — M1990 Unspecified osteoarthritis, unspecified site: Secondary | ICD-10-CM

## 2014-03-17 DIAGNOSIS — M1611 Unilateral primary osteoarthritis, right hip: Secondary | ICD-10-CM

## 2014-03-17 NOTE — Progress Notes (Signed)
[facility] [address] [place], [place] [address] Phone: [phone] Subjective:     CC: Right hip pain followup  WUX:[id number]:Subjective Samantha SettlerShelley W Ellison is a 63 y.o. female coming in with complaint of right hip pain. Patient's right knee did do well with injection previously. Patient unfortunately continued to have right hip pain. Patient does have x-rays show moderate to severe osteo-arthritis. Patient did have an injection back in August. Patient was doing significantly better for quite some time but has started to have worsening pain over the last several weeks. Patient describes most of the pain seems to be in the groin area. Worse at the end of the day. Can wake her up at night. Denies any fevers or chills or any abnormal weight loss. Not stopping her from her daily activities but can cause her to augment her activity.     Past medical history, social, surgical and family history all reviewed in electronic medical record.   Review of Systems: No headache, visual changes, nausea, vomiting, diarrhea, constipation, dizziness, abdominal pain, skin rash, fevers, chills, night sweats, weight loss, swollen lymph nodes, body aches, joint swelling, muscle aches, chest pain, shortness of breath, mood changes.   Objective Blood pressure 138/84, pulse 80, height 5\' 5"  (1.651 m), weight 193 lb (87.544 kg), SpO2 97 %.  General: No apparent distress alert and oriented x3 mood and affect normal, dressed appropriately.  HEENT: Pupils equal, extraocular movements intact  Respiratory: Patient's speak in full sentences and does not appear short of breath  Cardiovascular: No lower extremity edema, non tender, no erythema  Skin: Warm dry intact with no signs of infection or rash on extremities or on axial skeleton.  Abdomen: Soft nontender  Neuro: Cranial nerves II through XII are intact, neurovascularly intact in all extremities with 2+ DTRs and 2+ pulses.  Lymph: No  lymphadenopathy of posterior or anterior cervical chain or axillae bilaterally.  Gait normal with good balance and coordination.  MSK:  Non tender with full range of motion and good stability and symmetric strength and tone of shoulders, elbows, wrist, and ankles bilaterally.  Hip: ROM IR: 20 Deg but significant less pain, ER: 45 Deg, Flexion: 110 Deg, Extension: 100 Deg, Abduction: 45 Deg, Adduction: 45 Deg Strength IR: 5/5, ER: 5/5, Flexion: 5/5, Extension: 5/5, Abduction: 4/5, Adduction: 4/5 Pelvic alignment unremarkable to inspection and palpation. Standing hip rotation and gait without trendelenburg sign / unsteadiness. Greater trochanter without tenderness to palpation. No tenderness over piriformis and greater trochanter. Positive FADIR still No SI joint tenderness and normal minimal SI movement. Contralateral hip unremarkable  Procedure: Real-time Ultrasound Guided Injection of right hip Device: GE Logiq E  Ultrasound guided injection is preferred based studies that show increased duration, increased effect, greater accuracy, decreased procedural pain, increased response rate with ultrasound guided versus blind injection.  Verbal informed consent obtained.  Time-out conducted.  Noted no overlying erythema, induration, or other signs of local infection.  Skin prepped in a sterile fashion.  Local anesthesia: Topical Ethyl chloride.  With sterile technique and under real time ultrasound guidance:  Anterior capsule visualized, needle visualized going to the head neck junction at the anterior capsule. Pictures taken. Patient did have injection of 3 cc of 1% lidocaine, 3 cc of 0.5% Marcaine, and 1 cc of Kenalog 40 mg/dL. Completed without difficulty  Pain immediately resolved suggesting accurate placement of the medication.  Advised to call if fevers/chills, erythema, induration, drainage, or persistent bleeding.  Images permanently stored  and available for review in the ultrasound  unit.  Impression: Technically successful ultrasound guided injection.     Impression and Recommendations:     This case required medical decision making of moderate complexity.

## 2014-03-17 NOTE — Patient Instructions (Addendum)
Good to see you.   Exercises 3 times a week.   Ice in 6 hours. Love the idea of the water exercises See me againin 4-6 weeks. Or we can do xrays before  Happy New Year!

## 2014-03-17 NOTE — Assessment & Plan Note (Signed)
Patient was given an injection today. We discussed continuing the conservative therapy. Patient declined formal physical therapy. Patient will continue with the over-the-counter medications. We discussed the icing regimen. Patient will come back in one month. At that time I do think repeat x-rays would be helpful to help monitor when patient may need a hip replacement. Patient is having some difficulty with activities of daily living and we will monitor closely. Post injection instructions given.  Spent greater than 25 minutes with patient face-to-face and had greater than 50% of counseling including as described above in assessment and plan.

## 2014-03-29 ENCOUNTER — Ambulatory Visit (INDEPENDENT_AMBULATORY_CARE_PROVIDER_SITE_OTHER): Payer: Medicare PPO | Admitting: Internal Medicine

## 2014-03-29 ENCOUNTER — Encounter: Payer: Self-pay | Admitting: Internal Medicine

## 2014-03-29 VITALS — BP 134/82 | HR 73 | Temp 98.0°F | Ht 65.0 in | Wt 188.5 lb

## 2014-03-29 DIAGNOSIS — E669 Obesity, unspecified: Secondary | ICD-10-CM

## 2014-03-29 DIAGNOSIS — M1611 Unilateral primary osteoarthritis, right hip: Secondary | ICD-10-CM

## 2014-03-29 DIAGNOSIS — M1990 Unspecified osteoarthritis, unspecified site: Secondary | ICD-10-CM

## 2014-03-29 DIAGNOSIS — F411 Generalized anxiety disorder: Secondary | ICD-10-CM

## 2014-03-29 NOTE — Assessment & Plan Note (Signed)
Reviewed notes from Dr. Katrinka BlazingSmith. Encouraged follow up as scheduled with discussion of possible hip replacement. Continue prn Tylenol.

## 2014-03-29 NOTE — Assessment & Plan Note (Signed)
Chronic mild anxiety. Will continue prn alprazolam as needed.

## 2014-03-29 NOTE — Patient Instructions (Signed)
We will set up physical exam in Spring 2016.

## 2014-03-29 NOTE — Progress Notes (Signed)
Subjective:    Patient ID: Samantha Ellison, female    DOB: 1950-11-15, 64 y.o.   MRN: [id number]  HPI 64YO female presents for follow up.  Right hip pain - Aching made made worse by activity. Taking Tylenol on occasion for pain. Seen by Dr. Katrinka BlazingSmith and discussion re need for hip replacement. Pt is planning to follow up in 2 weeks.  Anxiety - Has not recently used Alprazolam. Notes some anxiety about possible pending surgery.  Trying to limit calories in effort to lose weight. Exercising several times per week with yoga and water aerobics.  Wt Readings from Last 3 Encounters:  03/29/14 188 lb 8 oz (85.503 kg)  03/17/14 193 lb (87.544 kg)  12/14/13 185 lb (83.915 kg)    Past medical, surgical, family and social history per today's encounter.  Review of Systems  Constitutional: Negative for fever, chills, appetite change, fatigue and unexpected weight change.  Eyes: Negative for visual disturbance.  Respiratory: Negative for shortness of breath.   Cardiovascular: Negative for chest pain and leg swelling.  Gastrointestinal: Negative for nausea, vomiting, abdominal pain, diarrhea and constipation.  Musculoskeletal: Positive for myalgias and arthralgias.  Skin: Negative for color change and rash.  Hematological: Negative for adenopathy. Does not bruise/bleed easily.  Psychiatric/Behavioral: Negative for dysphoric mood. The patient is nervous/anxious.        Objective:    BP 134/82 mmHg  Pulse 73  Temp(Src) 98 F (36.7 C) (Oral)  Ht 5\' 5"  (1.651 m)  Wt 188 lb 8 oz (85.503 kg)  BMI 31.37 kg/m2  SpO2 97% Physical Exam  Constitutional: She is oriented to person, place, and time. She appears well-developed and well-nourished. No distress.  HENT:  Head: Normocephalic and atraumatic.  Right Ear: External ear normal.  Left Ear: External ear normal.  Nose: Nose normal.  Mouth/Throat: Oropharynx is clear and moist. No oropharyngeal exudate.  Eyes: Conjunctivae are normal. Pupils  are equal, round, and reactive to light. Right eye exhibits no discharge. Left eye exhibits no discharge. No scleral icterus.  Neck: Normal range of motion. Neck supple. No tracheal deviation present. No thyromegaly present.  Cardiovascular: Normal rate, regular rhythm, normal heart sounds and intact distal pulses.  Exam reveals no gallop and no friction rub.   No murmur heard. Pulmonary/Chest: Effort normal and breath sounds normal. No accessory muscle usage. No tachypnea. No respiratory distress. She has no decreased breath sounds. She has no wheezes. She has no rhonchi. She has no rales. She exhibits no tenderness.  Musculoskeletal: She exhibits no edema or tenderness.       Right hip: She exhibits decreased range of motion.  Pain right hip with weight bearing  Lymphadenopathy:    She has no cervical adenopathy.  Neurological: She is alert and oriented to person, place, and time. No cranial nerve deficit. She exhibits normal muscle tone. Coordination normal.  Skin: Skin is warm and dry. No rash noted. She is not diaphoretic. No erythema. No pallor.  Psychiatric: Her behavior is normal. Judgment and thought content normal. Her mood appears anxious.          Assessment & Plan:   Problem List Items Addressed This Visit      Unprioritized   Anxiety state    Chronic mild anxiety. Will continue prn alprazolam as needed.    Arthritis of right hip - Primary    Reviewed notes from Dr. Katrinka BlazingSmith. Encouraged follow up as scheduled with discussion of possible hip replacement. Continue prn Tylenol.  Obesity (BMI 30-39.9)    Wt Readings from Last 3 Encounters:  03/29/14 188 lb 8 oz (85.503 kg)  03/17/14 193 lb (87.544 kg)  12/14/13 185 lb (83.915 kg)   Body mass index is 31.37 kg/(m^2). The patient is asked to make an attempt to improve diet and exercise patterns to aid in medical management of this problem.         Return in about 3 months (around 06/28/2014) for Physical.

## 2014-03-29 NOTE — Assessment & Plan Note (Signed)
Wt Readings from Last 3 Encounters:  03/29/14 188 lb 8 oz (85.503 kg)  03/17/14 193 lb (87.544 kg)  12/14/13 185 lb (83.915 kg)   Body mass index is 31.37 kg/(m^2). The patient is asked to make an attempt to improve diet and exercise patterns to aid in medical management of this problem.

## 2014-03-29 NOTE — Progress Notes (Signed)
Pre visit review using our clinic review tool, if applicable. No additional management support is needed unless otherwise documented below in the visit note. 

## 2014-04-05 ENCOUNTER — Telehealth: Payer: Self-pay | Admitting: Internal Medicine

## 2014-04-05 DIAGNOSIS — M1611 Unilateral primary osteoarthritis, right hip: Secondary | ICD-10-CM

## 2014-04-05 NOTE — Telephone Encounter (Signed)
Referral entered  

## 2014-04-05 NOTE — Telephone Encounter (Signed)
Pt request referral for her hip, please advise.

## 2014-04-14 ENCOUNTER — Ambulatory Visit: Payer: Medicare PPO | Admitting: Family Medicine

## 2014-06-09 ENCOUNTER — Ambulatory Visit: Payer: Self-pay | Admitting: Orthopedic Surgery

## 2014-06-11 LAB — URINE CULTURE

## 2014-06-22 ENCOUNTER — Inpatient Hospital Stay
Admit: 2014-06-22 | Disposition: A | Discharge status: Home / Self Care | Payer: Self-pay | Attending: Orthopedic Surgery | Admitting: Orthopedic Surgery

## 2014-06-23 LAB — PLATELET COUNT: Platelet: 168 10*3/uL (ref 150–440)

## 2014-06-23 LAB — BASIC METABOLIC PANEL
Anion Gap: 7 (ref 7–16)
BUN: 8 mg/dL
Calcium, Total: 8.5 mg/dL — ABNORMAL LOW
Chloride: 102 mmol/L
Co2: 27 mmol/L
Creatinine: 0.75 mg/dL
EGFR (African American): >60
Glucose: 118 mg/dL — ABNORMAL HIGH
Potassium: 3.4 mmol/L — ABNORMAL LOW
Sodium: 136 mmol/L

## 2014-06-23 LAB — HEMOGLOBIN: HGB: 11.7 g/dL — ABNORMAL LOW (ref 12.0–16.0)

## 2014-06-24 LAB — HEMOGLOBIN: HGB: 11.7 g/dL — AB (ref 12.0–16.0)

## 2014-07-12 LAB — SURGICAL PATHOLOGY

## 2014-07-18 NOTE — Op Note (Signed)
PATIENT NAME:  Samantha Ellison, Samantha Ellison MR#:  [id number] DATE OF BIRTH:  Mar 26, 1950  DATE OF PROCEDURE:  06/22/2014  PREOPERATIVE DIAGNOSIS: Primary right hip osteoarthritis.   POSTOPERATIVE DIAGNOSIS: Primary right hip osteoarthritis.    PROCEDURE: Right total hip replacement.   SURGEON: Leitha SchullerMichael J. Dareth Andrew, MD  ANESTHESIA: Spinal.   DESCRIPTION OF PROCEDURE: The patient was brought to the operating room and after adequate spinal anesthesia was obtained, the patient was placed on the operative table with the right foot in the Medacta attachment on the Amis table, left leg on a well-padded table. C-arm was brought in and good visualization of the hip was obtained with preop template. The hip was then prepped and draped in sterile fashion with appropriate patient identification and timeout procedures completed. Anterior approach was made to the hip, centered over the TFL and greater trochanter. The fascia was incised and the muscle retracted laterally The deep fascia was incised and the lateral femoral vessels ligated. The anterior capsule was then exposed and a capsulotomy performed, followed by femoral neck osteotomy and removal of the femoral head, which showed sclerotic bone over the entire head with deformation as well as sclerotic acetabulum with no cartilage remaining. Reaming was then carried out in the acetabulum up to 54 mm, at which point there was good bleeding bone. The 56 mm Mpact DM shell was impacted after a trial fit well followed by the external rotation. Pubofemoral and ischiofemoral releases and extension of the leg to allow for sequential broaching. Broaching was carried up to a size 4 with an S head on the trial. Leg lengths were restored and the hip was very stable. The final components were then inserted with a size 4 Amis stem, S-28 mm head with the liner for the 56 mm cup. These were assembled, impacted, and the hip reduced with stable appearance to the components. The hip was thoroughly  irrigated with Betadine solution and then closed with a heavy quill for the tensor, 2-0 quill subcutaneously, and skin staples. Xeroform, 4 x 4's, ABD, and tape were applied.   ESTIMATED BLOOD LOSS: 500.   COMPLICATIONS: None.   SPECIMEN: Removed femoral head.   IMPLANTS: Medacta Mpact DM 56 mm with a size 4 Amis stem, an S-28 mm head and the DM liner for the 56 mm cup.   CONDITION: To recovery room stable.    ____________________________ Leitha SchullerMichael J. Rahkeem Senft, MD mjm:bm D: 06/22/2014 20:19:25 ET T: 06/23/2014 06:55:59 ET JOB#: [id number]  cc: Leitha SchullerMichael J. Emmerson Taddei, MD, <Dictator> Leitha SchullerMICHAEL J Kaleah Hagemeister MD ELECTRONICALLY SIGNED 06/23/2014 12:34

## 2014-07-18 NOTE — Discharge Summary (Signed)
PATIENT NAME:  Samantha Ellison, Samantha Ellison MR#:  [id number] DATE OF BIRTH:  1950-03-30  DATE OF ADMISSION:  06/22/2014 DATE OF DISCHARGE:  06/25/2014  ADMITTING DIAGNOSIS: Right hip osteoarthritis.   DISCHARGE DIAGNOSIS: Right hip osteoarthritis.   PROCEDURE: Right total hip replacement.   SURGEON: Leitha SchullerMichael J. Menz, M.D.   ANESTHESIA: Spinal.   ESTIMATED BLOOD LOSS: 500 mL.  COMPLICATIONS: None.   SPECIMEN: Removed femoral head.   IMPLANTS: Medacta M-pact DM 56 mm with a size 4 Amis stem, a size S28 mm head and a DM liner for the 56 mm cup.   CONDITION: To recovery room stable.   HISTORY: The patient is a 64 year old female who presents for evaluation of right hip pain. She has had pain present for the last several years. Pain is located in her right groin and buttocks. She denies any radiation of the pain, back pain, numbness or tingling. She has increased pain with walking long periods. She describes pain and stiffness with activity. Her pain is worse by the end of the day. Pain will be 6 out of 10. Tylenol gives her some relief. She has had 2 right hip joint injections that have given her less than 3 to 4 weeks worth of relief. The patient tries to be very active with swimming and yoga, but unfortunately cannot due to her hip pain. Pain interferes with activities of daily living.  PHYSICAL EXAMINATION: GENERAL: Well developed, well nourished female in no apparent distress. Normal affect. Normal gait with no antalgic component.  HEART: Regular rate and rhythm.  LUNGS: Clear to auscultation bilaterally. No wheezing, rales, or rhonchi.  HEENT: Normal. Partial upper, not removable.  RIGHT LOWER EXTREMITY: The patient has no bony abnormality, no edema, no ecchymosis. She has decreased active and passive range of motion of the right hip. Normal range of motion of the knees and ankles. She has decreased internal and external rotation of the right hip. The patient has full abduction and adduction of the  hip. She has mild anterior joint tenderness. She has a negative axial load test at the hip joint. The patient has tenderness going from hip flexion into extension. There is moderate discomfort with hip internal rotation. She is neurovascularly intact in the right lower extremity.   HOSPITAL COURSE: The patient was admitted to the hospital on June 22, 2014. She had surgery that same day and was brought to the orthopedic floor from the PACU in stable condition. On postop day 1, the patient had an acute postop blood loss anemia with a hemoglobin of 11.7. On postop day 2, hemoglobin was stable at 11.7. The patient's vital signs are stable. On postop day 3, the patient did have a bowel movement. The patient made significant progress throughout her stay and by postop day 3 she was stable and ready for discharge to home with home health PT.  CONDITION AT DISCHARGE: Stable.   DISCHARGE INSTRUCTIONS: The patient may gradually increase weight-bearing on the effected extremity. Elevate the effected foot or leg on 1 or 2 pillows with the foot higher than the knee. Thigh-high TED hose on both legs and remove at bedtime and replace on arising the next morning. Elevate the heels off the bed. Incentive spirometer every hour while awake. Encourage cough and deep breathing. The patient may resume a regular diet as tolerated. Apply an ice pack to the effected area. Do not get the dressing or bandage wet or dirty. [facility] if the dressing gets water under it. Leave  the dressing on. Call [facility] orthopedics if any of the following occur: Bright red bleeding from the incision wound, fever above 101.5 degrees, redness, swelling, or drainage at the incision site. Call [facility] orthopedics if you experience any increased leg pain, numbness or weakness in your legs or bowel or bladder symptoms. Home physical therapy has been arranged for continuation of rehab program. Please call [facility]  orthopedics if a therapist has not contacted you within 48 hours of your return home.   DISCHARGE MEDICATIONS: Please see list of discharge medications from discharge instructions.   ____________________________ T. Cranston Neighbor, PA-C tcg:sb D: 06/25/2014 07:22:48 ET T: 06/25/2014 11:10:05 ET JOB#: [id number]  cc: T. Cranston Neighbor, PA-C, <Dictator> Evon Slack [place] ELECTRONICALLY SIGNED 07/15/2014 14:18

## 2014-09-07 ENCOUNTER — Telehealth: Payer: Self-pay

## 2014-09-07 ENCOUNTER — Other Ambulatory Visit: Payer: Self-pay | Admitting: *Deleted

## 2014-09-07 MED ORDER — AMOXICILLIN 500 MG PO CAPS: ORAL_CAPSULE | ORAL | Status: DC

## 2014-09-07 NOTE — Telephone Encounter (Signed)
The pt called hoping to get antibiotics before she went for a procedure at the dentist.

## 2014-09-07 NOTE — Telephone Encounter (Signed)
Please call in Amoxicillin 2gm po 1 hour prior to procedure.

## 2014-09-07 NOTE — Telephone Encounter (Signed)
rx sent, pt aware 

## 2014-09-07 NOTE — Telephone Encounter (Signed)
Spoke with pt she states she had hip replacement surgery in April and was advised for the first 2 years post surgery she should be treated with an antibiotic prior to dental procedures.  Please advise

## 2014-09-23 ENCOUNTER — Ambulatory Visit (INDEPENDENT_AMBULATORY_CARE_PROVIDER_SITE_OTHER): Payer: Medicare PPO | Admitting: Nurse Practitioner

## 2014-09-23 ENCOUNTER — Telehealth: Payer: Self-pay | Admitting: Internal Medicine

## 2014-09-23 VITALS — BP 128/98 | HR 78 | Temp 98.3°F | Resp 14 | Ht 65.0 in | Wt 196.4 lb

## 2014-09-23 DIAGNOSIS — R319 Hematuria, unspecified: Secondary | ICD-10-CM

## 2014-09-23 LAB — POCT URINALYSIS DIPSTICK
Bilirubin, UA: NEGATIVE
Glucose, UA: NEGATIVE
KETONES UA: NEGATIVE
Nitrite, UA: NEGATIVE
PROTEIN UA: NEGATIVE
Spec Grav, UA: <=1.005
UROBILINOGEN UA: 0.2
pH, UA: 6

## 2014-09-23 MED ORDER — CIPROFLOXACIN HCL 500 MG PO TABS: 500.0000 mg | ORAL_TABLET | Freq: Two times a day (BID) | ORAL | Status: DC

## 2014-09-23 NOTE — Progress Notes (Signed)
   Subjective:    Patient ID: Samantha Ellison, female    DOB: 20-Sep-1950, 64 y.o.   MRN: [id number]  HPI  Samantha Ellison is a 64 yo female with a CC of hematuria this morning.   1) [employer] exercise classes resumed she reports after hip surgery Urine- few drops of blood seen and with wiping few times this morning. Nocturia- 2-3 x at night  Low back pain yesterday and felt off yesterday.    Review of Systems  Constitutional: Positive for fatigue. Negative for fever, chills and diaphoresis.  Genitourinary: Positive for hematuria. Negative for dysuria, urgency, frequency, flank pain and pelvic pain.  Musculoskeletal: Positive for back pain.  Skin: Negative for rash.      Objective:   Physical Exam  Constitutional: She is oriented to person, place, and time. She appears well-developed and well-nourished. No distress.  BP 128/98 mmHg  Pulse 78  Temp(Src) 98.3 F (36.8 C)  Resp 14  Ht 5\' 5"  (1.651 m)  Wt 196 lb 6.4 oz (89.086 kg)  BMI 32.68 kg/m2  SpO2 97%   HENT:  Head: Normocephalic and atraumatic.  Right Ear: External ear normal.  Left Ear: External ear normal.  Abdominal: There is no CVA tenderness.  Neurological: She is alert and oriented to person, place, and time. No cranial nerve deficit. She exhibits normal muscle tone. Coordination normal.  Skin: Skin is warm and dry. No rash noted. She is not diaphoretic.  Psychiatric: She has a normal mood and affect. Her behavior is normal. Judgment and thought content normal.      Assessment & Plan:  Hematuria  1) POCT urine- shows probable UTI 2) Will start Cipro  3) Will obtain culture 4) Encouraged probiotics 5) FU prn worsening/failure to improve.

## 2014-09-23 NOTE — Telephone Encounter (Signed)
Patient Name: Samantha LazarSHELLEY Schoffstall DOB: [birth year] Initial Comment Caller states she has blood in her urine Nurse Assessment Nurse: Charna Elizabethrumbull, RN, Cathy Date/Time (Eastern Time): 09/23/2014 10:02:20 AM Confirm and document reason for call. If symptomatic, describe symptoms. ---Caller states she developed flank pain yesterday and blood in her urine this morning. No injury in the past 3 days. No fever. Has the patient traveled out of the country within the last 30 days? ---No Does the patient require triage? ---Yes Related visit to physician within the last 2 weeks? ---Yes Does the PT have any chronic conditions? (i.e. diabetes, asthma, etc.) ---Yes List chronic conditions. ---Hip replacement June 22, 2014 Guidelines Guideline Title Affirmed Question Affirmed Notes Urine - Blood In Side (flank) or back pain present Final Disposition User See Physician within 24 Hours Trumbull, RN, Cathy No appointments open within the next 24 Hours. [facility] will [facility] to [facility].

## 2014-09-23 NOTE — Patient Instructions (Signed)
Please make an appointment for your yearly physical

## 2014-09-23 NOTE — Telephone Encounter (Signed)
FYI, pt scheduled 3:15 today with you

## 2014-09-24 NOTE — Telephone Encounter (Signed)
Saw pt in office. Thanks!

## 2014-09-26 LAB — URINE CULTURE

## 2014-09-27 ENCOUNTER — Other Ambulatory Visit: Payer: Self-pay | Admitting: Nurse Practitioner

## 2014-09-27 MED ORDER — NITROFURANTOIN MONOHYD MACRO 100 MG PO CAPS: 100.0000 mg | ORAL_CAPSULE | Freq: Two times a day (BID) | ORAL | Status: DC

## 2014-09-28 ENCOUNTER — Encounter: Payer: Self-pay | Admitting: Internal Medicine

## 2014-09-28 ENCOUNTER — Ambulatory Visit (INDEPENDENT_AMBULATORY_CARE_PROVIDER_SITE_OTHER): Payer: Medicare PPO | Admitting: Internal Medicine

## 2014-09-28 VITALS — BP 151/87 | HR 76 | Temp 98.1°F | Ht 65.0 in | Wt 196.4 lb

## 2014-09-28 DIAGNOSIS — M1611 Unilateral primary osteoarthritis, right hip: Secondary | ICD-10-CM

## 2014-09-28 DIAGNOSIS — M1990 Unspecified osteoarthritis, unspecified site: Secondary | ICD-10-CM

## 2014-09-28 DIAGNOSIS — F4322 Adjustment disorder with anxiety: Secondary | ICD-10-CM

## 2014-09-28 DIAGNOSIS — N390 Urinary tract infection, site not specified: Secondary | ICD-10-CM | POA: Insufficient documentation

## 2014-09-28 DIAGNOSIS — N3000 Acute cystitis without hematuria: Secondary | ICD-10-CM

## 2014-09-28 LAB — POCT URINALYSIS DIPSTICK
Bilirubin, UA: NEGATIVE
Glucose, UA: NEGATIVE
Ketones, UA: NEGATIVE
LEUKOCYTES UA: NEGATIVE
NITRITE UA: NEGATIVE
Protein, UA: NEGATIVE
Spec Grav, UA: <=1.005
UROBILINOGEN UA: 0.2
pH, UA: 5

## 2014-09-28 MED ORDER — ALPRAZOLAM 0.25 MG PO TABS: 0.2500 mg | ORAL_TABLET | Freq: Every day | ORAL | Status: DC | PRN

## 2014-09-28 MED ORDER — NITROFURANTOIN MONOHYD MACRO 100 MG PO CAPS: 100.0000 mg | ORAL_CAPSULE | Freq: Two times a day (BID) | ORAL | Status: DC

## 2014-09-28 NOTE — Patient Instructions (Signed)
Continue Macrobid.  We will call with results of urine culture later this week.  Follow up in 4 weeks.

## 2014-09-28 NOTE — Assessment & Plan Note (Signed)
Symptoms of UTI improving. UA today shows only trace blood. Will repeat urine culture. Continue Macrobid. Follow up if any worsening symptoms, fever, chills or other concerns.

## 2014-09-28 NOTE — Progress Notes (Signed)
Subjective:    Patient ID: Samantha Ellison, female    DOB: [birth year], 64 y.o.   MRN: [id number]  HPI 64YO female presents for acute visit.  UTI - Seen in clinic 7/7 with UTI. Treated initially with Cipro. Urine culture then showed E.Coli which was resistant to Cipro, so changed to Macrobid.  Continues to have urinary frequency. No dysuria. No hematuria. No fever, chills. No flank pain. Started Macrobid yesterday. Tolerating well.   OA right hip s/p replacement - Started back with swimming and exercise. Completed PT. No pain with ambulation.   Past medical, surgical, family and social history per today's encounter.  Review of Systems  Constitutional: Negative for fever, chills, appetite change, fatigue and unexpected weight change.  Eyes: Negative for visual disturbance.  Respiratory: Negative for shortness of breath.   Cardiovascular: Negative for chest pain and leg swelling.  Gastrointestinal: Negative for abdominal pain.  Genitourinary: Positive for frequency. Negative for dysuria, urgency, hematuria, flank pain, decreased urine volume, difficulty urinating and pelvic pain.  Skin: Negative for color change and rash.  Hematological: Negative for adenopathy. Does not bruise/bleed easily.  Psychiatric/Behavioral: Negative for dysphoric mood. The patient is not nervous/anxious.        Objective:    BP 151/87 mmHg  Pulse 76  Temp(Src) 98.1 F (36.7 C) (Oral)  Ht  (1.651 m)  Wt 196 lb 6 oz (89.075 kg)  BMI 32.68 kg/m2  SpO2 98% Physical Exam  Constitutional: She is oriented to person, place, and time. She appears well-developed and well-nourished. No distress.  HENT:  Head: Normocephalic and atraumatic.  Right Ear: External ear normal.  Left Ear: External ear normal.  Nose: Nose normal.  Mouth/Throat: Oropharynx is clear and moist. No oropharyngeal exudate.  Eyes: Conjunctivae are normal. Pupils are equal, round, and reactive to light. Right eye exhibits no  discharge. Left eye exhibits no discharge. No scleral icterus.  Neck: Normal range of motion. Neck supple. No tracheal deviation present. No thyromegaly present.  Cardiovascular: Normal rate, regular rhythm, normal heart sounds and intact distal pulses.  Exam reveals no gallop and no friction rub.   No murmur heard. Pulmonary/Chest: Effort normal and breath sounds normal. No respiratory distress. She has no wheezes. She has no rales. She exhibits no tenderness.  Abdominal: There is no tenderness (no CVA tenderness).  Musculoskeletal: Normal range of motion. She exhibits no edema or tenderness.  Lymphadenopathy:    She has no cervical adenopathy.  Neurological: She is alert and oriented to person, place, and time. No cranial nerve deficit. She exhibits normal muscle tone. Coordination normal.  Skin: Skin is warm and dry. No rash noted. She is not diaphoretic. No erythema. No pallor.  Psychiatric: She has a normal mood and affect. Her behavior is normal. Judgment and thought content normal.          Assessment & Plan:   Problem List Items Addressed This Visit      Unprioritized   Adjustment disorder with anxious mood    Symptoms of mild anxiety well controlled with mild prn alprazolam. She understands the risk of this medication.      Relevant Medications   ALPRAZolam (XANAX) 0.25 MG tablet   Arthritis of right hip    Reviewed recent notes from right hip replacement. Pt is doing well. Ambulating without pain, exercising. Will continue to monitor.      UTI (urinary tract infection) - Primary    Symptoms of UTI improving. UA today shows only trace  blood. Will repeat urine culture. Continue Macrobid. Follow up if any worsening symptoms, fever, chills or other concerns.      Relevant Medications   nitrofurantoin, macrocrystal-monohydrate, (MACROBID) 100 MG capsule   Other Relevant Orders   POCT Urinalysis Dipstick (Completed)   CULTURE, URINE COMPREHENSIVE       Return in  about 4 weeks (around 10/26/2014) for Physical.

## 2014-09-28 NOTE — Assessment & Plan Note (Signed)
Reviewed recent notes from right hip replacement. Pt is doing well. Ambulating without pain, exercising. Will continue to monitor.

## 2014-09-28 NOTE — Assessment & Plan Note (Signed)
Symptoms of mild anxiety well controlled with mild prn alprazolam. She understands the risk of this medication.

## 2014-09-30 LAB — CULTURE, URINE COMPREHENSIVE
Colony Count: NO GROWTH
ORGANISM ID, BACTERIA: NO GROWTH

## 2014-10-06 ENCOUNTER — Encounter: Payer: Self-pay | Admitting: Nurse Practitioner

## 2014-10-22 ENCOUNTER — Ambulatory Visit: Payer: Medicare PPO | Admitting: Nurse Practitioner

## 2014-10-25 LAB — BASIC METABOLIC PANEL
ANION GAP: 10 (ref 7–16)
BUN: 16 mg/dL
Calcium, Total: 10.1 mg/dL
Chloride: 101 mmol/L
Co2: 27 mmol/L
Creatinine: 0.88 mg/dL
EGFR (African American): >60
EGFR (Non-African Amer.): >60
GLUCOSE: 86 mg/dL
Potassium: 3.7 mmol/L
SODIUM: 138 mmol/L

## 2014-10-25 LAB — URINALYSIS, COMPLETE
BILIRUBIN, UR: NEGATIVE
BLOOD: NEGATIVE
Bacteria: NONE SEEN
Glucose,UR: NEGATIVE mg/dL (ref 0–75)
KETONE: NEGATIVE
Leukocyte Esterase: NEGATIVE
NITRITE: NEGATIVE
Ph: 6 (ref 4.5–8.0)
Protein: NEGATIVE
RBC,UR: NONE SEEN /HPF (ref 0–5)
SPECIFIC GRAVITY: 1.008 (ref 1.003–1.030)

## 2014-10-25 LAB — SEDIMENTATION RATE: Erythrocyte Sed Rate: 11 mm/hr (ref 0–30)

## 2014-10-25 LAB — PROTIME-INR
INR: 0.9
Prothrombin Time: 13.2 secs

## 2014-10-25 LAB — CBC
HCT: 42.6 % (ref 35.0–47.0)
HGB: 13.8 g/dL (ref 12.0–16.0)
MCH: 29.5 pg (ref 26.0–34.0)
MCHC: 32.5 g/dL (ref 32.0–36.0)
MCV: 91 fL (ref 80–100)
Platelet: 219 10*3/uL (ref 150–440)
RBC: 4.68 10*6/uL (ref 3.80–5.20)
RDW: 12.6 % (ref 11.5–14.5)
WBC: 6.5 10*3/uL (ref 3.6–11.0)

## 2014-10-25 LAB — MRSA PCR SCREENING

## 2014-10-25 LAB — APTT: Activated PTT: 33.4 secs (ref 23.6–35.9)

## 2014-10-28 ENCOUNTER — Ambulatory Visit (INDEPENDENT_AMBULATORY_CARE_PROVIDER_SITE_OTHER): Payer: Medicare PPO | Admitting: Internal Medicine

## 2014-10-28 ENCOUNTER — Other Ambulatory Visit (HOSPITAL_COMMUNITY)
Admission: RE | Admit: 2014-10-28 | Discharge: 2014-10-28 | Disposition: A | Discharge status: Home / Self Care | Payer: Medicare PPO | Source: Ambulatory Visit | Attending: Internal Medicine | Admitting: Internal Medicine

## 2014-10-28 ENCOUNTER — Encounter: Payer: Self-pay | Admitting: Internal Medicine

## 2014-10-28 VITALS — BP 128/80 | HR 78 | Temp 98.4°F | Ht 65.0 in | Wt 192.0 lb

## 2014-10-28 DIAGNOSIS — Z1151 Encounter for screening for human papillomavirus (HPV): Secondary | ICD-10-CM | POA: Diagnosis present

## 2014-10-28 DIAGNOSIS — Z0000 Encounter for general adult medical examination without abnormal findings: Secondary | ICD-10-CM

## 2014-10-28 DIAGNOSIS — Z124 Encounter for screening for malignant neoplasm of cervix: Secondary | ICD-10-CM | POA: Insufficient documentation

## 2014-10-28 DIAGNOSIS — R7989 Other specified abnormal findings of blood chemistry: Secondary | ICD-10-CM | POA: Diagnosis not present

## 2014-10-28 LAB — CBC WITH DIFFERENTIAL/PLATELET
Basophils Absolute: 0 10*3/uL (ref 0.0–0.1)
Basophils Relative: 0.6 % (ref 0.0–3.0)
Eosinophils Absolute: 0.2 10*3/uL (ref 0.0–0.7)
Eosinophils Relative: 2.1 % (ref 0.0–5.0)
HCT: 41.5 % (ref 36.0–46.0)
Hemoglobin: 13.9 g/dL (ref 12.0–15.0)
Lymphocytes Relative: 31.3 % (ref 12.0–46.0)
Lymphs Abs: 2.4 10*3/uL (ref 0.7–4.0)
MCHC: 33.5 g/dL (ref 30.0–36.0)
MCV: 84.8 fl (ref 78.0–100.0)
Monocytes Absolute: 0.6 10*3/uL (ref 0.1–1.0)
Monocytes Relative: 7.5 % (ref 3.0–12.0)
NEUTROS ABS: 4.5 10*3/uL (ref 1.4–7.7)
NEUTROS PCT: 58.5 % (ref 43.0–77.0)
Platelets: 216 10*3/uL (ref 150.0–400.0)
RBC: 4.89 Mil/uL (ref 3.87–5.11)
RDW: 14.5 % (ref 11.5–15.5)
WBC: 7.7 10*3/uL (ref 4.0–10.5)

## 2014-10-28 LAB — LIPID PANEL
CHOLESTEROL: 202 mg/dL — AB (ref 0–200)
HDL: 43 mg/dL (ref >39.00)
NONHDL: 158.68
Total CHOL/HDL Ratio: 5
Triglycerides: 342 mg/dL — ABNORMAL HIGH (ref 0.0–149.0)
VLDL: 68.4 mg/dL — ABNORMAL HIGH (ref 0.0–40.0)

## 2014-10-28 LAB — COMPREHENSIVE METABOLIC PANEL
ALBUMIN: 4.2 g/dL (ref 3.5–5.2)
ALK PHOS: 138 U/L — AB (ref 39–117)
ALT: 25 U/L (ref 0–35)
AST: 22 U/L (ref 0–37)
BUN: 17 mg/dL (ref 6–23)
CO2: 27 meq/L (ref 19–32)
Calcium: 9.6 mg/dL (ref 8.4–10.5)
Chloride: 105 mEq/L (ref 96–112)
Creatinine, Ser: 0.86 mg/dL (ref 0.40–1.20)
GFR: 70.09 mL/min (ref >60.00)
Glucose, Bld: 75 mg/dL (ref 70–99)
POTASSIUM: 4 meq/L (ref 3.5–5.1)
SODIUM: 142 meq/L (ref 135–145)
TOTAL PROTEIN: 6.6 g/dL (ref 6.0–8.3)
Total Bilirubin: 0.8 mg/dL (ref 0.2–1.2)

## 2014-10-28 LAB — TSH: TSH: 2.49 u[IU]/mL (ref 0.35–4.50)

## 2014-10-28 LAB — HEMOGLOBIN A1C: Hgb A1c MFr Bld: 5.8 % (ref 4.6–6.5)

## 2014-10-28 LAB — LDL CHOLESTEROL, DIRECT: LDL DIRECT: 124 mg/dL

## 2014-10-28 NOTE — Progress Notes (Signed)
Subjective:    Patient ID: Samantha Ellison, female    DOB: 30-Jun-1950, 64 y.o.   MRN: [id number]  HPI  64YO female presents for physical.  Feeling well. No concerns today. Mother recently passed away. Erasmo Score was last week. Difficult time for her family. However, she feels that she is handling things well.   Past Medical History  Diagnosis Date  . History of chicken pox   . Arthritis   . Fibrocystic breast disease   . Arthritis of right knee   . Bilateral ankle fractures    Family History  Problem Relation Age of Onset  . Hyperlipidemia Mother   . Hypertension Mother   . COPD Mother   . Aneurysm Mother   . Hypertension Father   . COPD Father   . Heart disease Brother     CAD s/p stents   Past Surgical History  Procedure Laterality Date  . Vaginal delivery    . Dilation and curettage of uterus     Social History   Social History  . Marital Status: Married    Spouse Name: N/A  . Number of Children: N/A  . Years of Education: N/A   Social History Main Topics  . Smoking status: Never Smoker   . Smokeless tobacco: None  . Alcohol Use: No  . Drug Use: No  . Sexual Activity: Not Asked   Other Topics Concern  . None   Social History Narrative   Lives in [place] with husband. Has 2 cats.      Work - Retired Adult nurse      Diet - regular      Exercise - walking, limited by hip pain. Yoga twice weekly. Gardening    Review of Systems  Constitutional: Negative for fever, chills, appetite change, fatigue and unexpected weight change.  Eyes: Negative for visual disturbance.  Respiratory: Negative for shortness of breath.   Cardiovascular: Negative for chest pain and leg swelling.  Gastrointestinal: Negative for nausea, vomiting, abdominal pain, diarrhea and constipation.  Musculoskeletal: Negative for myalgias and arthralgias.  Skin: Negative for color change and rash.  Hematological: Negative for adenopathy. Does not bruise/bleed easily.   Psychiatric/Behavioral: Negative for suicidal ideas, sleep disturbance and dysphoric mood. The patient is not nervous/anxious.        Objective:    BP 128/80 mmHg  Pulse 78  Temp(Src) 98.4 F (36.9 C) (Oral)  Ht  (1.651 m)  Wt 192 lb (87.091 kg)  BMI 31.95 kg/m2  SpO2 98% Physical Exam  Constitutional: She is oriented to person, place, and time. She appears well-developed and well-nourished. No distress.  HENT:  Head: Normocephalic and atraumatic.  Right Ear: External ear normal.  Left Ear: External ear normal.  Nose: Nose normal.  Mouth/Throat: Oropharynx is clear and moist. No oropharyngeal exudate.  Eyes: Conjunctivae are normal. Pupils are equal, round, and reactive to light. Right eye exhibits no discharge. Left eye exhibits no discharge. No scleral icterus.  Neck: Normal range of motion. Neck supple. No tracheal deviation present. No thyromegaly present.  Cardiovascular: Normal rate, regular rhythm, normal heart sounds and intact distal pulses.  Exam reveals no gallop and no friction rub.   No murmur heard. Pulmonary/Chest: Effort normal and breath sounds normal. No respiratory distress. She has no wheezes. She has no rales. She exhibits no tenderness.  Abdominal: Soft. Bowel sounds are normal. She exhibits no distension and no mass. There is no tenderness. There is no rebound and no guarding.  Genitourinary: Rectum normal and uterus normal. No breast swelling, tenderness, discharge or bleeding. Pelvic exam was performed with patient supine. There is no rash, tenderness or lesion on the right labia. There is no rash, tenderness or lesion on the left labia. Uterus is not enlarged and not tender. Cervix exhibits no motion tenderness, no discharge and no friability. Right adnexum displays no mass, no tenderness and no fullness. Left adnexum displays no mass, no tenderness and no fullness. There is erythema (mild consistent with atrophy) in the vagina. No tenderness in the  vagina. No vaginal discharge found.  Musculoskeletal: Normal range of motion. She exhibits no edema or tenderness.  Lymphadenopathy:    She has no cervical adenopathy.  Neurological: She is alert and oriented to person, place, and time. No cranial nerve deficit. She exhibits normal muscle tone. Coordination normal.  Skin: Skin is warm and dry. No rash noted. She is not diaphoretic. No erythema. No pallor.  Psychiatric: She has a normal mood and affect. Her behavior is normal. Judgment and thought content normal.          Assessment & Plan:   Problem List Items Addressed This Visit      Unprioritized   Routine general medical examination at a health care facility - Primary    General medical exam normal today including breast and pelvic exam. PAP pending. Mammogram ordered. Pt will get flu vaccine this fall. Other vaccines are UTD. Labs as ordered. Encouraged healthy diet and exercise.      Relevant Orders   MM Digital Screening   TSH   Comprehensive metabolic panel   Hemoglobin A1c   Lipid panel   CBC with Differential/Platelet   Cytology - PAP       Return in about 6 months (around 04/30/2015) for Recheck.

## 2014-10-28 NOTE — Patient Instructions (Signed)

## 2014-10-28 NOTE — Assessment & Plan Note (Addendum)
General medical exam normal today including breast and pelvic exam. PAP pending. Mammogram ordered. Colonscopy UTD. Pt will get flu vaccine this fall. Other vaccines are UTD. Labs as ordered. Encouraged healthy diet and exercise.

## 2014-11-02 ENCOUNTER — Other Ambulatory Visit: Payer: Self-pay | Admitting: Internal Medicine

## 2014-11-02 ENCOUNTER — Ambulatory Visit
Admission: RE | Admit: 2014-11-02 | Discharge: 2014-11-02 | Disposition: A | Discharge status: Home / Self Care | Payer: Medicare PPO | Source: Ambulatory Visit | Attending: Internal Medicine | Admitting: Internal Medicine

## 2014-11-02 DIAGNOSIS — Z1231 Encounter for screening mammogram for malignant neoplasm of breast: Secondary | ICD-10-CM | POA: Diagnosis not present

## 2014-11-02 DIAGNOSIS — Z0000 Encounter for general adult medical examination without abnormal findings: Secondary | ICD-10-CM | POA: Insufficient documentation

## 2014-11-02 LAB — CYTOLOGY - PAP

## 2014-11-17 ENCOUNTER — Ambulatory Visit (INDEPENDENT_AMBULATORY_CARE_PROVIDER_SITE_OTHER): Payer: Medicare PPO

## 2014-11-17 VITALS — BP 130/80 | HR 83 | Temp 98.1°F | Resp 12 | Ht 65.0 in | Wt 197.6 lb

## 2014-11-17 DIAGNOSIS — Z23 Encounter for immunization: Secondary | ICD-10-CM

## 2014-11-17 DIAGNOSIS — H9193 Unspecified hearing loss, bilateral: Secondary | ICD-10-CM

## 2014-11-17 DIAGNOSIS — Z0000 Encounter for general adult medical examination without abnormal findings: Secondary | ICD-10-CM

## 2014-11-17 NOTE — Progress Notes (Signed)
Subjective:   Samantha Ellison is a 64 y.o. female who presents for Medicare Annual (Subsequent) preventive examination.  Review of Systems:  No ROS.  Medicare Wellness Visit.  Cardiac Risk Factors include: advanced age (>26men, >68 women)     Objective:     The goal of the wellness visit is to assist the patient how to close the gaps in care and create a preventative care plan for the patient.  This was a routine visit for [facility].   Vitals: BP 130/80 mmHg  Pulse 83  Temp(Src) 98.1 F (36.7 C) (Oral)  Resp 12  Ht  (1.651 m)  Wt 197 lb 9.6 oz (89.631 kg)  BMI 32.88 kg/m2  SpO2 97%  LMP  (LMP Unknown)  Tobacco History  Smoking status  . Never Smoker   Smokeless tobacco  . Not on file     Counseling given: Not Answered   Past Medical History  Diagnosis Date  . History of chicken pox   . Arthritis   . Fibrocystic breast disease   . Arthritis of right knee   . Bilateral ankle fractures    Past Surgical History  Procedure Laterality Date  . Vaginal delivery    . Dilation and curettage of uterus     Family History  Problem Relation Age of Onset  . Hyperlipidemia Mother   . Hypertension Mother   . COPD Mother   . Aneurysm Mother   . Hypertension Father   . COPD Father   . Heart disease Brother     CAD s/p stents   History  Sexual Activity  . Sexual Activity: Not Currently    Outpatient Encounter Prescriptions as of 11/17/2014  Medication Sig  . ALPRAZolam (XANAX) 0.25 MG tablet Take 1 tablet (0.25 mg total) by mouth daily as needed for anxiety.  Marland Kitchen amoxicillin (AMOXIL) 500 MG capsule Take 2gm po one hour prior to dental procedure  . BIOTIN 5000 PO Take 5,000 mcg by mouth once.  . Calcium Carb-Cholecalciferol (CALCIUM + D3 PO) Take 1,200 Units by mouth 2 (two) times daily.  . Cholecalciferol (VITAMIN D-3 PO) Take 1,000 Units by mouth once.  . Lactobacillus (PROBIOTIC ACIDOPHILUS PO) Take 100 % by mouth once.  . nitrofurantoin,  macrocrystal-monohydrate, (MACROBID) 100 MG capsule Take 1 capsule (100 mg total) by mouth 2 (two) times daily.  . TURMERIC PO Take 300 mg by mouth 2 (two) times daily.   No facility-administered encounter medications on file as of 11/17/2014.    Activities of Daily Living In your present state of health, do you have any difficulty performing the following activities: 11/17/2014  Hearing? Y  Vision? N  Difficulty concentrating or making decisions? N  Walking or climbing stairs? N  Dressing or bathing? N  Doing errands, shopping? N  Preparing Food and eating ? N  Using the Toilet? N  In the past six months, have you accidently leaked urine? N  Do you have problems with loss of bowel control? N  Managing your Medications? N  Managing your Finances? N  Housekeeping or managing your Housekeeping? N    Patient Care Team: Shelia Media, MD as PCP - General (Internal Medicine)    Assessment:    Calcium and Vit D as appropriate/ Osteoporosis risk reviewed  Taking meds without issues; no barriers identified  Hepatitis C Screening completed today  High dose Influenza vaccine administered today   Safety issues reviewed; smoke detectors in the home.  Firearms locked in a  secure area.  Wears seatbelts when driving or riding with others.  No violence in the home.  No identified risk were noted; The patient was oriented x 3; appropriate in dress and manner and no objective failures at ADL's or IADL's.   Ophthalmologist- Physician at [facility] in [place].  Last visit 1 year ago.  Patient to follow up with eye exam.  Hearing screening- Failed bilateral whisper and audiometry screening. Referral placed to Audiology.   Patient C/O hearing loss x2 years.  3 month follow up appointment made.  End of life planning was discussed; aging in home or other; plans to return updated copy of HCPOA/Living Will and sign DNR form at 3 month follow up visit.   Exercise Activities and Dietary  recommendations Current Exercise Habits:: The patient does not participate in regular exercise at present (see patient goal )  Goals    . Increase lean proteins     Eat healthier.  Eat more vegetables and fruit.  Decrease carb intake.    . Increase physical activity     Increase physical activity and lose weight by walking 30 minute sessions for 3 days a week.       Fall Risk Fall Risk  11/17/2014 03/29/2014  Falls in the past year? No No   Depression Screen PHQ 2/9 Scores 11/17/2014 03/29/2014  PHQ - 2 Score 0 0     Cognitive Testing MMSE - Mini Mental State Exam 11/17/2014  Orientation to time 5  Orientation to Place 5  Registration 3  Attention/ Calculation 5  Recall 3  Language- name 2 objects 2  Language- repeat 1  Language- follow 3 step command 3  Language- read & follow direction 1  Write a sentence 1  Copy design 1  Total score 30    Immunization History  Administered Date(s) Administered  . Influenza, High Dose Seasonal PF 11/17/2014  . Influenza-Unspecified 12/27/2012, 01/02/2014  . Pneumococcal Conjugate-13 08/27/2013  . Pneumococcal Polysaccharide-23 01/30/2012  . Tdap 12/27/2008  . Zoster 12/27/2012   Screening Tests Health Maintenance  Topic Date Due  . Hepatitis C Screening  [birth year]  . INFLUENZA VACCINE  10/18/2014  . MAMMOGRAM  11/01/2016  . PNA vac Low Risk Adult (2 of 2 - PPSV23) 01/29/2017  . TETANUS/TDAP  12/28/2018  . COLONOSCOPY  08/28/2023  . DEXA SCAN  Completed  . ZOSTAVAX  Completed      Plan:    During the course of the visit the patient was educated and counseled about the following appropriate screening and preventive services:   Vaccines to include Pneumoccal, Influenza, Hepatitis B, Td, Zostavax, HCV  Electrocardiogram  Cardiovascular Disease  Colorectal cancer screening  Bone density screening  Diabetes screening  Glaucoma screening  Mammography/PAP  Nutrition counseling   Patient Instructions (the written  plan) was given to the patient.   Ashok Pall, LPN  1/61/0960

## 2014-11-17 NOTE — Patient Instructions (Addendum)
Samantha Ellison,  Thank you for taking time to come for your Medicare Wellness Visit.  I appreciate your ongoing commitment to your health goals. Please review the following plan we discussed and let me know if I can assist you in the future.  Audiology (Hearing) referral placed.  Bring Notarized copy of HCPOA to follow up appointment.  Eye exam appointment follow up due      Fat and Cholesterol Control Diet Fat and cholesterol levels in your blood and organs are influenced by your diet. High levels of fat and cholesterol may lead to diseases of the heart, small and large blood vessels, gallbladder, liver, and pancreas. CONTROLLING FAT AND CHOLESTEROL WITH DIET Although exercise and lifestyle factors are important, your diet is key. That is because certain foods are known to raise cholesterol and others to lower it. The goal is to balance foods for their effect on cholesterol and more importantly, to replace saturated and trans fat with other types of fat, such as monounsaturated fat, polyunsaturated fat, and omega-3 fatty acids. On average, a person should consume no more than 15 to 17 g of saturated fat daily. Saturated and trans fats are considered "bad" fats, and they will raise LDL cholesterol. Saturated fats are primarily found in animal products such as meats, butter, and cream. However, that does not mean you need to give up all your favorite foods. Today, there are good tasting, low-fat, low-cholesterol substitutes for most of the things you like to eat. Choose low-fat or nonfat alternatives. Choose round or loin cuts of red meat. These types of cuts are lowest in fat and cholesterol. Chicken (without the skin), fish, veal, and ground [place] breast are great choices. Eliminate fatty meats, such as hot dogs and salami. Even shellfish have little or no saturated fat. Have a 3 oz (85 g) portion when you eat lean meat, poultry, or fish. Trans fats are also called "partially hydrogenated oils."  They are oils that have been scientifically manipulated so that they are solid at room temperature resulting in a longer shelf life and improved taste and texture of foods in which they are added. Trans fats are found in stick margarine, some tub margarines, cookies, crackers, and baked goods.  When baking and cooking, oils are a great substitute for butter. The monounsaturated oils are especially beneficial since it is believed they lower LDL and raise HDL. The oils you should avoid entirely are saturated tropical oils, such as coconut and palm.  Remember to eat a lot from food groups that are naturally free of saturated and trans fat, including fish, fruit, vegetables, beans, grains (barley, rice, couscous, bulgur wheat), and pasta (without cream sauces).  IDENTIFYING FOODS THAT LOWER FAT AND CHOLESTEROL  Soluble fiber may lower your cholesterol. This type of fiber is found in fruits such as apples, vegetables such as broccoli, potatoes, and carrots, legumes such as beans, peas, and lentils, and grains such as barley. Foods fortified with plant sterols (phytosterol) may also lower cholesterol. You should eat at least 2 g per day of these foods for a cholesterol lowering effect.  Read package labels to identify low-saturated fats, trans fat free, and low-fat foods at the supermarket. Select cheeses that have only 2 to 3 g saturated fat per ounce. Use a heart-healthy tub margarine that is free of trans fats or partially hydrogenated oil. When buying baked goods (cookies, crackers), avoid partially hydrogenated oils. Breads and muffins should be made from whole grains (whole-wheat or whole oat flour, instead  of "flour" or "enriched flour"). Buy non-creamy canned soups with reduced salt and no added fats.  FOOD PREPARATION TECHNIQUES  Never deep-fry. If you must fry, either stir-fry, which uses very little fat, or use non-stick cooking sprays. When possible, broil, bake, or roast meats, and steam vegetables.  Instead of putting butter or margarine on vegetables, use lemon and herbs, applesauce, and cinnamon (for squash and sweet potatoes). Use nonfat yogurt, salsa, and low-fat dressings for salads.  LOW-SATURATED FAT / LOW-FAT FOOD SUBSTITUTES Meats / Saturated Fat (g)  Avoid: Steak, marbled (3 oz/85 g) / 11 g  Choose: Steak, lean (3 oz/85 g) / 4 g  Avoid: Hamburger (3 oz/85 g) / 7 g  Choose: Hamburger, lean (3 oz/85 g) / 5 g  Avoid: Ham (3 oz/85 g) / 6 g  Choose: Ham, lean cut (3 oz/85 g) / 2.4 g  Avoid: Chicken, with skin, dark meat (3 oz/85 g) / 4 g  Choose: Chicken, skin removed, dark meat (3 oz/85 g) / 2 g  Avoid: Chicken, with skin, light meat (3 oz/85 g) / 2.5 g  Choose: Chicken, skin removed, light meat (3 oz/85 g) / 1 g Dairy / Saturated Fat (g)  Avoid: Whole milk (1 cup) / 5 g  Choose: Low-fat milk, 2% (1 cup) / 3 g  Choose: Low-fat milk, 1% (1 cup) / 1.5 g  Choose: Skim milk (1 cup) / 0.3 g  Avoid: Hard cheese (1 oz/28 g) / 6 g  Choose: Skim milk cheese (1 oz/28 g) / 2 to 3 g  Avoid: Cottage cheese, 4% fat (1 cup) / 6.5 g  Choose: Low-fat cottage cheese, 1% fat (1 cup) / 1.5 g  Avoid: Ice cream (1 cup) / 9 g  Choose: Sherbet (1 cup) / 2.5 g  Choose: Nonfat frozen yogurt (1 cup) / 0.3 g  Choose: Frozen fruit bar / trace  Avoid: Whipped cream (1 tbs) / 3.5 g  Choose: Nondairy whipped topping (1 tbs) / 1 g Condiments / Saturated Fat (g)  Avoid: Mayonnaise (1 tbs) / 2 g  Choose: Low-fat mayonnaise (1 tbs) / 1 g  Avoid: Butter (1 tbs) / 7 g  Choose: Extra light margarine (1 tbs) / 1 g  Avoid: Coconut oil (1 tbs) / 11.8 g  Choose: Olive oil (1 tbs) / 1.8 g  Choose: Corn oil (1 tbs) / 1.7 g  Choose: Safflower oil (1 tbs) / 1.2 g  Choose: Sunflower oil (1 tbs) / 1.4 g  Choose: Soybean oil (1 tbs) / 2.4 g  Choose: Canola oil (1 tbs) / 1 g Document Released: 03/05/2005 Document Revised: 06/30/2012 Document Reviewed: 06/03/2013 [employer] Patient  Information 2015 [place], [place]. This information is not intended to replace advice given to you by your health care provider. Make sure you discuss any questions you have with your health care provider.  Hearing Loss A hearing loss is sometimes called deafness. Hearing loss may be partial or total. CAUSES Hearing loss may be caused by:  Wax in the ear canal.  Infection of the ear canal.  Infection of the middle ear.  Trauma to the ear or surrounding area.  Fluid in the middle ear.  A hole in the eardrum (perforated eardrum).  Exposure to loud sounds or music.  Problems with the hearing nerve.  Certain medications. Hearing loss without wax, infection, or a history of injury may mean that the nerve is involved. Hearing loss with severe dizziness, nausea and vomiting or ringing in  the ear may suggest a hearing nerve irritation or problems in the middle or inner ear. If hearing loss is untreated, there is a greater likelihood for residual or permanent hearing loss. DIAGNOSIS A hearing test (audiometry) assesses hearing loss. The audiometry test needs to be performed by a hearing specialist (audiologist). TREATMENT Treatment for recent onset of hearing loss may include:  Ear wax removal.  Medications that kill germs (antibiotics).  Cortisone medications.  Prompt follow up with the appropriate specialist. Return of hearing depends on the cause of your hearing loss, so proper medical follow-up is important. Some hearing loss may not be reversible, and a caregiver should discuss care and treatment options with you. SEEK MEDICAL CARE IF:   You have a severe headache, dizziness, or changes in vision.  You have new or increased weakness.  You develop repeated vomiting or other serious medical problems.  You have a fever. Document Released: 03/05/2005 Document Revised: 05/28/2011 Document Reviewed: 06/30/2009 [facility] Patient Information 2015 [place], [place]. This information is  not intended to replace advice given to you by your health care provider. Make sure you discuss any questions you have with your health care provider.

## 2014-11-17 NOTE — Progress Notes (Signed)
Annual Wellness Visit as completed by Health Coach was reviewed in full.  

## 2014-11-18 LAB — HEPATITIS C ANTIBODY: HCV AB: NEGATIVE

## 2015-02-04 ENCOUNTER — Encounter: Payer: Self-pay | Admitting: Family Medicine

## 2015-02-04 ENCOUNTER — Telehealth: Payer: Self-pay | Admitting: *Deleted

## 2015-02-04 ENCOUNTER — Ambulatory Visit (INDEPENDENT_AMBULATORY_CARE_PROVIDER_SITE_OTHER): Payer: Medicare PPO | Admitting: Family Medicine

## 2015-02-04 VITALS — BP 136/94 | HR 70 | Temp 97.6°F | Ht 65.0 in | Wt 195.6 lb

## 2015-02-04 DIAGNOSIS — T148 Other injury of unspecified body region: Secondary | ICD-10-CM | POA: Diagnosis not present

## 2015-02-04 DIAGNOSIS — Z23 Encounter for immunization: Secondary | ICD-10-CM

## 2015-02-04 DIAGNOSIS — W5503XA Scratched by cat, initial encounter: Secondary | ICD-10-CM | POA: Diagnosis not present

## 2015-02-04 MED ORDER — AMOXICILLIN-POT CLAVULANATE 875-125 MG PO TABS: 1.0000 | ORAL_TABLET | Freq: Two times a day (BID) | ORAL | Status: DC

## 2015-02-04 NOTE — Progress Notes (Signed)
Patient ID: Samantha SettlerShelley W Ellison, female   DOB: [birth year], 64 y.o.   MRN: [id number]  Marikay AlarEric Sonnenberg, MD Phone: [phone]  Roslyn SmilingShelley W Katrinka Ellison is a 64 y.o. female who presents today for same-day visit.  Scratch: Patient notes last night she was sitting on the couch with her cat her lap. Summary startled The cat jumped out of her labs scratching her inner thighs. She notes there was some bleeding from them that she cleaned them and the bleeding stopped. She has no bleeding now. She is no surrounding erythema. She's not had any fevers. She does note she has a history of a right hip replacement and notes she wondered if she should be on antibiotics she was advised that she needed antibiotics for dental procedures. She feels well. She denied being bitten by the cat. She denies any hip pain and other joint pain.  PMH: nonsmoker.   ROS see history of present illness  Objective  Physical Exam Filed Vitals:   02/04/15 1054  BP: 136/94  Pulse: 70  Temp: 97.6 F (36.4 C)   Physical Exam  Constitutional: She is well-developed, well-nourished, and in no distress.  HENT:  Head: Normocephalic.  Cardiovascular: Normal rate, regular rhythm and normal heart sounds.  Exam reveals no gallop and no friction rub.   No murmur heard. Pulmonary/Chest: Effort normal and breath sounds normal. No respiratory distress. She has no wheezes. She has no rales.  Skin: She is not diaphoretic.  Bilateral inner thighs halfway up her thigh with 3-4 areas of abrasion consistent with scratches, there is scab overlying these, there is no bleeding, there is no surrounding erythema, there is no tenderness, there is no induration, there is no fluctuance, there is no drainage, there is no inguinal lymphadenopathy   she's able to ambulate well. She has no erythema of her hip. No tenderness of the hip.   Assessment/Plan: Please see individual problem list.  Cat scratch Patient with areas of scratches on bilateral thighs from a  cat. 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 The lesions do not appear infected at this time. 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 She has no hip discomfort at this time. Given the cat scratch and the risk for joint infection in her prosthetic joint thigh discussed starting the patient on Augmentin prophylactically cover for this bacteria. She agreed with this plan. We will start her on Augmentin 1 tablet twice daily for 5 days. She was advised to take a probiotic with this due to risk of diarrhea with antibiotics. Her tetanus vaccination was updated as well as it had been greater than 5 years since her last one. She was given return precautions. She will follow-up in 5 days for recheck.    Meds ordered this encounter  Medications  . amoxicillin-clavulanate (AUGMENTIN) 875-125 MG tablet    Sig: Take 1 tablet by mouth 2 (two) times daily.    Dispense:  10 tablet    Refill:  0    Marikay AlarEric Sonnenberg

## 2015-02-04 NOTE — Progress Notes (Signed)
Pre visit review using our clinic review tool, if applicable. No additional management support is needed unless otherwise documented below in the visit note. 

## 2015-02-04 NOTE — Patient Instructions (Signed)
Nice to meet you. We will treat you with Augmentin to cover prophylactically for Pasteurella which is a type of infection you can get from cat scratches or bites. Please take a probiotic while you're on this. If he develops diarrhea please let us know. If you develop pain, redness, joint pain, fevers, or feel poorly and he should seek medical attention immediately.

## 2015-02-04 NOTE — Addendum Note (Signed)
Addended by: Dorian PodWHEELEY, JAMIE J on: 02/04/2015 01:11 PM   Modules accepted: Orders

## 2015-02-04 NOTE — Assessment & Plan Note (Signed)
Patient with areas of scratches on bilateral thighs from a cat. 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 The lesions do not appear infected at this time. 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 She has no hip discomfort at this time. Given the cat scratch and the risk for joint infection in her prosthetic joint thigh discussed starting the patient on Augmentin prophylactically cover for this bacteria. She agreed with this plan. We will start her on Augmentin 1 tablet twice daily for 5 days. She was advised to take a probiotic with this due to risk of diarrhea with antibiotics. Her tetanus vaccination was updated as well as it had been greater than 5 years since her last one. She was given return precautions. She will follow-up in 5 days for recheck.

## 2015-02-04 NOTE — Telephone Encounter (Signed)
Patient was concerned about her injury she sustained, when her cat, in which was sitting on her lap, had gotten startled and left small puncture wounds to her right thigh. Patient stated that she had hip surgery in April and was told to get a antibiotic from her PCP, when ever she had an injury to her thigh area.  Patient requested a call back and antibiotic if needed. Please advise .

## 2015-02-04 NOTE — Telephone Encounter (Signed)
Can you please scheduled her an appt so we can look at the site. Thanks

## 2015-02-09 ENCOUNTER — Encounter: Payer: Self-pay | Admitting: Family Medicine

## 2015-02-09 ENCOUNTER — Ambulatory Visit (INDEPENDENT_AMBULATORY_CARE_PROVIDER_SITE_OTHER): Payer: Medicare PPO | Admitting: Family Medicine

## 2015-02-09 VITALS — BP 124/78 | HR 79 | Temp 98.7°F | Ht 65.0 in | Wt 196.8 lb

## 2015-02-09 DIAGNOSIS — R21 Rash and other nonspecific skin eruption: Secondary | ICD-10-CM | POA: Insufficient documentation

## 2015-02-09 DIAGNOSIS — T148 Other injury of unspecified body region: Secondary | ICD-10-CM

## 2015-02-09 DIAGNOSIS — W5503XA Scratched by cat, initial encounter: Secondary | ICD-10-CM | POA: Diagnosis not present

## 2015-02-09 MED ORDER — MUPIROCIN CALCIUM 2 % EX CREA: 1.0000 "application " | TOPICAL_CREAM | Freq: Two times a day (BID) | CUTANEOUS | Status: DC

## 2015-02-09 NOTE — Progress Notes (Signed)
Pre visit review using our clinic review tool, if applicable. No additional management support is needed unless otherwise documented below in the visit note. 

## 2015-02-09 NOTE — Patient Instructions (Signed)
Nice to see you. The rash you developed may be a folliculitis. We will treat this with an antibiotic ointment. The Scratches look significantly improved. Please continue to monitor these. If you develops spreading rash, erythema, fever, joint pain, chills, or any new or changing symptoms please seek medical attention.

## 2015-02-09 NOTE — Assessment & Plan Note (Signed)
Much improved. Finished antibiotics. We'll continue to monitor. Given return precautions.

## 2015-02-09 NOTE — Assessment & Plan Note (Signed)
Patient with scattered erythematous papules. They appear to be in a follicular pattern, though patient does not have any hair in these areas so it is difficult to tell if it is a true folliculitis. She has been in the pool more frequently recently. Does not appear to be medication reaction rash as this was present prior to starting on Augmentin. Suspect a folliculitis. We'll treat with topical mupirocin to cover for possible staph folliculitis, though could also be pseudomonal folliculitis. She is advised to stay out of the pool. She is well-appearing and her vitals are stable. If the rash spreads, she develops fevers, joint pain, chills, or any new or changing symptoms she will seek medical attention. Given return precautions.

## 2015-02-09 NOTE — Progress Notes (Signed)
Patient ID: Sharion SettlerShelley W Camera, female   DOB: [birth year], 64 y.o.   MRN: [id number]  Marikay AlarEric Sonnenberg, MD Phone: [phone]  Roslyn SmilingShelley W Katrinka BlazingSmith is a 64 y.o. female who presents today for follow-up.  Scratch: Patient notes the areas of scratches much improved. She's not had any erythema or warmth or tenderness in the areas. She finished the Augmentin. She's not had any hip pain or swelling or erythema.  Patient notes starting middle of last week she noted some itchy skin with a few little bumps on her arms. She notes this occurred prior to the cat scratches. [facility]. She notes her arms still itch, though now her anterior thighs have rash on it right greater than left. She's not changed any soaps. She's not come into contact with bedbugs or with people with rash. She's not had a fevers. She feels well overall. She's not tried any medication on these. Notes this was present prior to starting the Augmentin.  PMH: nonsmoker.   ROS the history of present illness  Objective  Physical Exam Filed Vitals:   02/09/15 1049  BP: 124/78  Pulse: 79  Temp: 98.7 F (37.1 C)   Physical Exam  Constitutional: She is well-developed, well-nourished, and in no distress.  HENT:  Head: Normocephalic and atraumatic.  Skin: She is not diaphoretic.  Areas of inner thighs were cat scratches relocated her much improved. There is some mild scabbing. There is no surrounding erythema. There is no tenderness. There is no fluctuance. Next There are scattered erythematous papules mostly located on her right anterior lateral thigh and left thigh, though there are some on her bilateral forearms. They do not appear to be pustules. There is no tenderness or fluctuance.     Assessment/Plan: Please see individual problem list.  Cat scratch Much improved. Finished antibiotics. We'll continue to monitor. Given return precautions.  Rash and nonspecific skin eruption Patient with scattered  erythematous papules. They appear to be in a follicular pattern, though patient does not have any hair in these areas so it is difficult to tell if it is a true folliculitis. She has been in the pool more frequently recently. Does not appear to be medication reaction rash as this was present prior to starting on Augmentin. Suspect a folliculitis. We'll treat with topical mupirocin to cover for possible staph folliculitis, though could also be pseudomonal folliculitis. She is advised to stay out of the pool. She is well-appearing and her vitals are stable. If the rash spreads, she develops fevers, joint pain, chills, or any new or changing symptoms she will seek medical attention. Given return precautions.    Meds ordered this encounter  Medications  . mupirocin cream (BACTROBAN) 2 %    Sig: Apply 1 application topically 2 (two) times daily. To area of rash    Dispense:  30 g    Refill:  0    Marikay AlarEric Sonnenberg

## 2015-02-16 ENCOUNTER — Ambulatory Visit: Payer: Medicare PPO | Admitting: Internal Medicine

## 2015-02-16 ENCOUNTER — Ambulatory Visit (INDEPENDENT_AMBULATORY_CARE_PROVIDER_SITE_OTHER): Payer: Medicare PPO | Admitting: Internal Medicine

## 2015-02-16 ENCOUNTER — Encounter: Payer: Self-pay | Admitting: Internal Medicine

## 2015-02-16 VITALS — BP 129/79 | HR 74 | Temp 98.6°F | Ht 65.0 in | Wt 195.5 lb

## 2015-02-16 DIAGNOSIS — T148 Other injury of unspecified body region: Secondary | ICD-10-CM | POA: Diagnosis not present

## 2015-02-16 DIAGNOSIS — W5503XA Scratched by cat, initial encounter: Secondary | ICD-10-CM

## 2015-02-16 DIAGNOSIS — W5503XD Scratched by cat, subsequent encounter: Secondary | ICD-10-CM | POA: Diagnosis not present

## 2015-02-16 MED ORDER — MUPIROCIN CALCIUM 2 % EX CREA: 1.0000 "application " | TOPICAL_CREAM | Freq: Two times a day (BID) | CUTANEOUS | Status: DC

## 2015-02-16 NOTE — Assessment & Plan Note (Signed)
Symptoms and rash have completely resolved. Will monitor for any recurrent symptoms. Follow up prn.

## 2015-02-16 NOTE — Patient Instructions (Signed)
Follow up next year for physical exam.

## 2015-02-16 NOTE — Progress Notes (Signed)
Pre visit review using our clinic review tool, if applicable. No additional management support is needed unless otherwise documented below in the visit note. 

## 2015-02-16 NOTE — Progress Notes (Signed)
Subjective:    Patient ID: Samantha Ellison, female    DOB: 12-18-1950, 64 y.o.   MRN: [id number]  HPI  64YO female presents for follow up.  Last seen by Dr. Birdie SonsSonnenberg in 01/2015 for cat scratch. Treated with amoxicillin and symptoms resolved. No fever, chills, skin redness.  Otherwise, feeling well.   Wt Readings from Last 3 Encounters:  02/16/15 195 lb 8 oz (88.678 kg)  02/09/15 196 lb 12.8 oz (89.268 kg)  02/04/15 195 lb 9.6 oz (88.724 kg)   BP Readings from Last 3 Encounters:  02/16/15 129/79  02/09/15 124/78  02/04/15 136/94    Past Medical History  Diagnosis Date  . History of chicken pox   . Arthritis   . Fibrocystic breast disease   . Arthritis of right knee   . Bilateral ankle fractures    Family History  Problem Relation Age of Onset  . Hyperlipidemia Mother   . Hypertension Mother   . COPD Mother   . Aneurysm Mother   . Hypertension Father   . COPD Father   . Heart disease Brother     CAD s/p stents   Past Surgical History  Procedure Laterality Date  . Vaginal delivery    . Dilation and curettage of uterus     Social History   Social History  . Marital Status: Married    Spouse Name: N/A  . Number of Children: N/A  . Years of Education: N/A   Social History Main Topics  . Smoking status: Never Smoker   . Smokeless tobacco: None  . Alcohol Use: 0.6 oz/week    1 Glasses of wine per week     Comment: Social drink  . Drug Use: No  . Sexual Activity: Not Currently   Other Topics Concern  . None   Social History Narrative   [place] in [place]. Has 2 cats.      Work - Retired Adult nursemedical transcription [employer]      Diet - regular      Exercise - walking, limited by hip pain. Yoga twice weekly. Gardening    Review of Systems  Constitutional: Negative for fever, chills, appetite change, fatigue and unexpected weight change.  Eyes: Negative for visual disturbance.  Respiratory: Negative for shortness of breath.     Cardiovascular: Negative for chest pain and leg swelling.  Gastrointestinal: Negative for nausea, vomiting, abdominal pain, diarrhea and constipation.  Musculoskeletal: Negative for myalgias and arthralgias.  Skin: Negative for color change, rash and wound.  Hematological: Negative for adenopathy. Does not bruise/bleed easily.  Psychiatric/Behavioral: Negative for sleep disturbance and dysphoric mood. The patient is not nervous/anxious.        Objective:    BP 129/79 mmHg  Pulse 74  Temp(Src) 98.6 F (37 C) (Oral)  Ht 5\' 5"  (1.651 m)  Wt 195 lb 8 oz (88.678 kg)  BMI 32.53 kg/m2  SpO2 99%  LMP  (LMP Unknown) Physical Exam  Constitutional: She is oriented to person, place, and time. She appears well-developed and well-nourished. No distress.  HENT:  Head: Normocephalic and atraumatic.  Right Ear: External ear normal.  Left Ear: External ear normal.  Nose: Nose normal.  Mouth/Throat: Oropharynx is clear and moist. No oropharyngeal exudate.  Eyes: Conjunctivae are normal. Pupils are equal, round, and reactive to light. Right eye exhibits no discharge. Left eye exhibits no discharge. No scleral icterus.  Neck: Normal range of motion. Neck supple. No tracheal deviation present. No thyromegaly present.  Cardiovascular:  Normal rate, regular rhythm, normal heart sounds and intact distal pulses.  Exam reveals no gallop and no friction rub.   No murmur heard. Pulmonary/Chest: Effort normal and breath sounds normal. No respiratory distress. She has no wheezes. She has no rales. She exhibits no tenderness.  Musculoskeletal: Normal range of motion. She exhibits no edema or tenderness.  Lymphadenopathy:    She has no cervical adenopathy.  Neurological: She is alert and oriented to person, place, and time. No cranial nerve deficit. She exhibits normal muscle tone. Coordination normal.  Skin: Skin is warm and dry. No rash noted. She is not diaphoretic. No erythema. No pallor.  Psychiatric: She  has a normal mood and affect. Her behavior is normal. Judgment and thought content normal.          Assessment & Plan:   Problem List Items Addressed This Visit      Unprioritized   Cat scratch - Primary    Symptoms and rash have completely resolved. Will monitor for any recurrent symptoms. Follow up prn.          Return in about 9 months (around 11/16/2015) for Physical.

## 2015-03-23 ENCOUNTER — Telehealth: Payer: Self-pay | Admitting: Internal Medicine

## 2015-03-23 NOTE — Telephone Encounter (Signed)
She should check with her orthopedic surgeon, however generally, in the past, I believe she has taken Amoxicillin prior to dental checks. This would be Amoxicillin 2gm take 1 hr prior to dental procedure

## 2015-03-23 NOTE — Telephone Encounter (Signed)
She is going to check with her Orthopedic surgeon first.

## 2015-03-23 NOTE — Telephone Encounter (Signed)
Please advise if antibiotic needed.  thanks

## 2015-03-23 NOTE — Telephone Encounter (Signed)
Pt called about needing an antibiotic before she goes to the dentist on next Tuesday. Pharmacy is [facility] [place], [place] - [address] Thank You!

## 2015-05-02 ENCOUNTER — Ambulatory Visit: Payer: Medicare PPO | Admitting: Internal Medicine

## 2015-11-16 ENCOUNTER — Encounter: Payer: Self-pay | Admitting: Family

## 2015-11-16 ENCOUNTER — Encounter: Payer: Medicare PPO | Admitting: Internal Medicine

## 2015-11-16 ENCOUNTER — Ambulatory Visit (INDEPENDENT_AMBULATORY_CARE_PROVIDER_SITE_OTHER): Payer: Medicare Other | Admitting: Family

## 2015-11-16 ENCOUNTER — Other Ambulatory Visit (HOSPITAL_COMMUNITY)
Admission: RE | Admit: 2015-11-16 | Discharge: 2015-11-16 | Disposition: A | Discharge status: Home / Self Care | Payer: Medicare Other | Source: Ambulatory Visit | Attending: Family | Admitting: Family

## 2015-11-16 VITALS — BP 148/82 | HR 73 | Temp 98.4°F | Ht 65.0 in | Wt 192.6 lb

## 2015-11-16 DIAGNOSIS — Z0000 Encounter for general adult medical examination without abnormal findings: Secondary | ICD-10-CM | POA: Diagnosis not present

## 2015-11-16 DIAGNOSIS — Z01419 Encounter for gynecological examination (general) (routine) without abnormal findings: Secondary | ICD-10-CM | POA: Diagnosis present

## 2015-11-16 DIAGNOSIS — F411 Generalized anxiety disorder: Secondary | ICD-10-CM | POA: Diagnosis not present

## 2015-11-16 DIAGNOSIS — Z1151 Encounter for screening for human papillomavirus (HPV): Secondary | ICD-10-CM | POA: Diagnosis present

## 2015-11-16 DIAGNOSIS — Z23 Encounter for immunization: Secondary | ICD-10-CM | POA: Diagnosis not present

## 2015-11-16 DIAGNOSIS — R030 Elevated blood-pressure reading, without diagnosis of hypertension: Secondary | ICD-10-CM | POA: Insufficient documentation

## 2015-11-16 LAB — CBC WITH DIFFERENTIAL/PLATELET
Basophils Absolute: 0 10*3/uL (ref 0.0–0.1)
Basophils Relative: 0.6 % (ref 0.0–3.0)
EOS ABS: 0.1 10*3/uL (ref 0.0–0.7)
Eosinophils Relative: 2.2 % (ref 0.0–5.0)
HEMATOCRIT: 45.1 % (ref 36.0–46.0)
HEMOGLOBIN: 15.4 g/dL — AB (ref 12.0–15.0)
LYMPHS PCT: 28.6 % (ref 12.0–46.0)
Lymphs Abs: 1.9 10*3/uL (ref 0.7–4.0)
MCHC: 34.2 g/dL (ref 30.0–36.0)
MCV: 89.6 fl (ref 78.0–100.0)
MONO ABS: 0.5 10*3/uL (ref 0.1–1.0)
Monocytes Relative: 8.5 % (ref 3.0–12.0)
Neutro Abs: 3.9 10*3/uL (ref 1.4–7.7)
Neutrophils Relative %: 60.1 % (ref 43.0–77.0)
Platelets: 231 10*3/uL (ref 150.0–400.0)
RBC: 5.03 Mil/uL (ref 3.87–5.11)
RDW: 13 % (ref 11.5–15.5)
WBC: 6.5 10*3/uL (ref 4.0–10.5)

## 2015-11-16 LAB — COMPREHENSIVE METABOLIC PANEL
ALBUMIN: 4.6 g/dL (ref 3.5–5.2)
ALT: 36 U/L — AB (ref 0–35)
AST: 33 U/L (ref 0–37)
Alkaline Phosphatase: 89 U/L (ref 39–117)
BUN: 18 mg/dL (ref 6–23)
CHLORIDE: 105 meq/L (ref 96–112)
CO2: 28 mEq/L (ref 19–32)
CREATININE: 0.86 mg/dL (ref 0.40–1.20)
Calcium: 9.6 mg/dL (ref 8.4–10.5)
GFR: 69.86 mL/min (ref >60.00)
Glucose, Bld: 93 mg/dL (ref 70–99)
Potassium: 4.4 mEq/L (ref 3.5–5.1)
SODIUM: 140 meq/L (ref 135–145)
TOTAL PROTEIN: 7.3 g/dL (ref 6.0–8.3)
Total Bilirubin: 1.4 mg/dL — ABNORMAL HIGH (ref 0.2–1.2)

## 2015-11-16 LAB — LIPID PANEL
Cholesterol: 222 mg/dL — ABNORMAL HIGH (ref 0–200)
HDL: 48.2 mg/dL (ref >39.00)
LDL CALC: 137 mg/dL — AB (ref 0–99)
NONHDL: 173.41
Total CHOL/HDL Ratio: 5
Triglycerides: 183 mg/dL — ABNORMAL HIGH (ref 0.0–149.0)
VLDL: 36.6 mg/dL (ref 0.0–40.0)

## 2015-11-16 LAB — HEMOGLOBIN A1C: Hgb A1c MFr Bld: 5.6 % (ref 4.6–6.5)

## 2015-11-16 LAB — TSH: TSH: 1.72 u[IU]/mL (ref 0.35–4.50)

## 2015-11-16 LAB — VITAMIN D 25 HYDROXY (VIT D DEFICIENCY, FRACTURES): VITD: 41.48 ng/mL (ref 30.00–100.00)

## 2015-11-16 MED ORDER — SERTRALINE HCL 50 MG PO TABS: 50.0000 mg | ORAL_TABLET | Freq: Every day | ORAL | 3 refills | Status: DC

## 2015-11-16 NOTE — Assessment & Plan Note (Signed)
Patient and I jointly agreed to discontinue Xanax and opt for safer, more consistent use of an SSRI, Zoloft. Will f/u in one month.

## 2015-11-16 NOTE — Assessment & Plan Note (Signed)
Patient is up-to-date on colonoscopy and immunizations. Pap smear and CBE performed today. Patient will schedule [facility] . Reordered DEXA scan. Flu vaccine given today and screening labs ordered.

## 2015-11-16 NOTE — Patient Instructions (Signed)
Pleasure meeting you.  Generalized Anxiety Disorder Generalized anxiety disorder (GAD) is a mental disorder. It interferes with life functions, including relationships, work, and school. GAD is different from normal anxiety, which everyone experiences at some point in their lives in response to specific life events and activities. Normal anxiety actually helps us prepare for and get through these life events and activities. Normal anxiety goes away after the event or activity is over.  GAD causes anxiety that is not necessarily related to specific events or activities. It also causes excess anxiety in proportion to specific events or activities. The anxiety associated with GAD is also difficult to control. GAD can vary from mild to severe. People with severe GAD can have intense waves of anxiety with physical symptoms (panic attacks).  SYMPTOMS The anxiety and worry associated with GAD are difficult to control. This anxiety and worry are related to many life events and activities and also occur more days than not for 6 months or longer. People with GAD also have three or more of the following symptoms (one or more in children):  Restlessness.   Fatigue.  Difficulty concentrating.   Irritability.  Muscle tension.  Difficulty sleeping or unsatisfying sleep. DIAGNOSIS GAD is diagnosed through an assessment by your health care provider. Your health care provider will ask you questions aboutyour mood,physical symptoms, and events in your life. Your health care provider may ask you about your medical history and use of alcohol or drugs, including prescription medicines. Your health care provider may also do a physical exam and blood tests. Certain medical conditions and the use of certain substances can cause symptoms similar to those associated with GAD. Your health care provider may refer you to a mental health specialist for further evaluation. TREATMENT The following therapies are usually  used to treat GAD:   Medication. Antidepressant medication usually is prescribed for long-term daily control. Antianxiety medicines may be added in severe cases, especially when panic attacks occur.   Talk therapy (psychotherapy). Certain types of talk therapy can be helpful in treating GAD by providing support, education, and guidance. A form of talk therapy called cognitive behavioral therapy can teach you healthy ways to think about and react to daily life events and activities.  Stress managementtechniques. These include yoga, meditation, and exercise and can be very helpful when they are practiced regularly. A mental health specialist can help determine which treatment is best for you. Some people see improvement with one therapy. However, other people require a combination of therapies.   This information is not intended to replace advice given to you by your health care provider. Make sure you discuss any questions you have with your health care provider.   Document Released: 06/30/2012 Document Revised: 03/26/2014 Document Reviewed: 06/30/2012 Elsevier Interactive Patient Education 2016 Elsevier Inc.  

## 2015-11-16 NOTE — Progress Notes (Signed)
Subjective:    Patient ID: Samantha Ellison, female    DOB: 09-Jan-1951, 65 y.o.   MRN: [id number]  CC: Samantha Ellison is a 65 y.o. female who presents today for physical exam.    HPI: Patient presents for physical and to establish care.  Anxiety-for about a year. Takes Xanax 2, maybe 3 times a week particularly when she is riding as a passenger in the car as she gets anxious. Notes occasional difficulty falling asleep.  Osteopenia- Will recheck DEXA today. On calcium.       Colorectal Cancer Screening: UTD  Breast Cancer Screening: Due; patient will schedule Cervical Cancer Screening: last 2014, normal per patient. No h/o abnormal pap or GYN cancer. Done today. Bone Health screening/DEXA for 65+: Done 2015, osteopenic. Moderate fracture risk. On calcium. Had right hip replacement 2016. Declines fosamax.   Immunizations       Tetanus - UTD        Pneumococcal - UTD; Pneumococcal 23 again in 1 year.   Labs: Screening labs today. Exercise: Gets regular exercise.  Alcohol use: Rare Smoking/tobacco use: Nonsmoker.  Regular dental exams: UTD Wears seat belt: Yes.  HISTORY:  Past Medical History:  Diagnosis Date  . Arthritis   . Arthritis of right knee   . Bilateral ankle fractures   . Fibrocystic breast disease   . History of chicken pox     Past Surgical History:  Procedure Laterality Date  . DILATION AND CURETTAGE OF UTERUS    . VAGINAL DELIVERY     Family History  Problem Relation Age of Onset  . Hyperlipidemia Mother   . Hypertension Mother   . COPD Mother   . Aneurysm Mother   . Hypertension Father   . COPD Father   . Heart disease Brother     CAD s/p stents      ALLERGIES: Compazine [prochlorperazine edisylate]  Current Outpatient Prescriptions on File Prior to Visit  Medication Sig Dispense Refill  . BIOTIN 5000 PO Take 5,000 mcg by mouth once.    . Calcium Carb-Cholecalciferol (CALCIUM + D3 PO) Take 1,200 Units by mouth 2 (two) times daily.    .  Cholecalciferol (VITAMIN D-3 PO) Take 1,000 Units by mouth once.    . Lactobacillus (PROBIOTIC ACIDOPHILUS PO) Take 100 % by mouth once.    . mupirocin cream (BACTROBAN) 2 % Apply 1 application topically 2 (two) times daily. To area of rash 30 g 0  . TURMERIC PO Take 300 mg by mouth 2 (two) times daily.     No current facility-administered medications on file prior to visit.     Social History  Substance Use Topics  . Smoking status: Never Smoker  . Smokeless tobacco: Never Used  . Alcohol use 0.6 oz/week    1 Glasses of wine per week     Comment: Social drink    Review of Systems  Constitutional: Negative for chills, fever and unexpected weight change.  HENT: Negative for congestion.   Respiratory: Negative for cough and shortness of breath.   Cardiovascular: Negative for chest pain, palpitations and leg swelling.  Gastrointestinal: Negative for nausea and vomiting.  Musculoskeletal: Negative for arthralgias and myalgias.  Skin: Negative for rash.  Neurological: Negative for headaches.  Hematological: Negative for adenopathy.  Psychiatric/Behavioral: Negative for confusion. The patient is nervous/anxious.       Objective:    BP (!) 148/82   Pulse 73   Temp 98.4 F (36.9 C) (Oral)   Ht  5\' 5"  (1.651 m)   Wt 192 lb 9.6 oz (87.4 kg)   LMP  (LMP Unknown)   SpO2 97%   BMI 32.05 kg/m   BP Readings from Last 3 Encounters:  11/16/15 (!) 148/82  02/16/15 129/79  02/09/15 124/78   Wt Readings from Last 3 Encounters:  11/16/15 192 lb 9.6 oz (87.4 kg)  02/16/15 195 lb 8 oz (88.7 kg)  02/09/15 196 lb 12.8 oz (89.3 kg)    Physical Exam  Constitutional: She appears well-developed and well-nourished.  Eyes: Conjunctivae are normal.  Neck: No thyroid mass and no thyromegaly present.  Cardiovascular: Normal rate, regular rhythm, normal heart sounds and normal pulses.   Pulmonary/Chest: Effort normal and breath sounds normal. She has no wheezes. She has no rhonchi. She has no  rales. Right breast exhibits no inverted nipple, no mass, no nipple discharge, no skin change and no tenderness. Left breast exhibits no inverted nipple, no mass, no nipple discharge, no skin change and no tenderness. Breasts are symmetrical.  No masses or asymmetry appreciated during CBE.  Genitourinary: Uterus is not enlarged, not fixed and not tender. Cervix exhibits no motion tenderness, no discharge and no friability. Right adnexum displays no mass, no tenderness and no fullness. Left adnexum displays no mass, no tenderness and no fullness.  Genitourinary Comments: Pap performed. No CMT. Unable to appreciated ovaries.  Lymphadenopathy:       Head (right side): No submental, no submandibular, no tonsillar, no preauricular, no posterior auricular and no occipital adenopathy present.       Head (left side): No submental, no submandibular, no tonsillar, no preauricular, no posterior auricular and no occipital adenopathy present.       Right cervical: No superficial cervical, no deep cervical and no posterior cervical adenopathy present.      Left cervical: No superficial cervical, no deep cervical and no posterior cervical adenopathy present.    She has no axillary adenopathy.       Right axillary: No pectoral and no lateral adenopathy present.       Left axillary: No pectoral and no lateral adenopathy present. Neurological: She is alert.  Skin: Skin is warm and dry.  Psychiatric: She has a normal mood and affect. Her speech is normal and behavior is normal. Thought content normal.  Vitals reviewed.      Assessment & Plan:   Problem List Items Addressed This Visit      Other   Anxiety state    Patient and I jointly agreed to discontinue Xanax and opt for safer, more consistent use of an SSRI, Zoloft. Will f/u in one month.       Relevant Medications   sertraline (ZOLOFT) 50 MG tablet   Routine general medical examination at a health care facility    Patient is up-to-date on  colonoscopy and immunizations. Pap smear and CBE performed today. Patient will schedule her mammogram at [facility] . Reordered DEXA scan. Flu vaccine given today and screening labs ordered.      Elevated BP    Elevated BP. Keeping monitor at home. If remains high, will consider starting ACE-I.        Other Visit Diagnoses    Routine physical examination    -  Primary   Relevant Orders   Ambulatory referral to Gastroenterology   DG Bone Density   CBC with Differential/Platelet   Comprehensive metabolic panel   Hemoglobin A1c   Lipid panel   TSH   VITAMIN D 25 Hydroxy (  Vit-D Deficiency, Fractures)   Cytology - PAP   MM DIGITAL SCREENING BILATERAL       I have discontinued Samantha Ellison's ALPRAZolam. I am also having her start on sertraline. Additionally, I am having her maintain her Calcium Carb-Cholecalciferol (CALCIUM + D3 PO), Lactobacillus (PROBIOTIC ACIDOPHILUS PO), BIOTIN 5000 PO, TURMERIC PO, Cholecalciferol (VITAMIN D-3 PO), and mupirocin cream.   Meds ordered this encounter  Medications  . sertraline (ZOLOFT) 50 MG tablet    Sig: Take 1 tablet (50 mg total) by mouth at bedtime.    Dispense:  90 tablet    Refill:  3    Order Specific Question:   Supervising Provider    Answer:   Sherlene Shams [[id number]]    Return precautions given.   Risks, benefits, and alternatives of the medications and treatment plan prescribed today were discussed, and patient expressed understanding.   Education regarding symptom management and diagnosis given to patient on AVS.   Continue to follow with Wynona Dove, MD for routine health maintenance.   Samantha Settler and I agreed with plan.   Rennie Plowman, FNP  Total of 25 minutes spent with patient, greater than 50% of which was spent in discussion of  Anxiety and BZDs.

## 2015-11-16 NOTE — Progress Notes (Signed)
Pre visit review using our clinic review tool, if applicable. No additional management support is needed unless otherwise documented below in the visit note. 

## 2015-11-16 NOTE — Assessment & Plan Note (Signed)
Elevated BP. Keeping monitor at home. If remains high, will consider starting ACE-I.

## 2015-11-17 ENCOUNTER — Ambulatory Visit: Payer: Medicare PPO

## 2015-11-17 ENCOUNTER — Telehealth: Payer: Self-pay | Admitting: Family

## 2015-11-17 ENCOUNTER — Telehealth: Payer: Self-pay | Admitting: Internal Medicine

## 2015-11-17 DIAGNOSIS — E785 Hyperlipidemia, unspecified: Secondary | ICD-10-CM

## 2015-11-17 LAB — CYTOLOGY - PAP

## 2015-11-17 MED ORDER — SIMVASTATIN 20 MG PO TABS: 20.0000 mg | ORAL_TABLET | Freq: Every evening | ORAL | 3 refills | Status: DC

## 2015-11-17 NOTE — Progress Notes (Signed)
Left message for patient to return call.

## 2015-11-17 NOTE — Progress Notes (Signed)
10 year risk is 9.9%.

## 2015-11-17 NOTE — Telephone Encounter (Signed)
Pt called returning your call regarding labs. Thank you! °

## 2015-11-18 NOTE — Telephone Encounter (Signed)
Patient has been notified. Also notified on mychart

## 2015-11-25 NOTE — Telephone Encounter (Signed)
Patient stated that I have left numerous messages.  We have discussed results already.

## 2015-11-25 NOTE — Progress Notes (Signed)
Left message for patient to return call back.  

## 2015-11-25 NOTE — Telephone Encounter (Signed)
Pt left message today stating that she is returning Brocks phone call.

## 2015-12-15 ENCOUNTER — Ambulatory Visit
Admission: RE | Admit: 2015-12-15 | Discharge: 2015-12-15 | Disposition: A | Discharge status: Home / Self Care | Payer: Medicare Other | Source: Ambulatory Visit | Attending: Family | Admitting: Family

## 2015-12-15 DIAGNOSIS — Z0000 Encounter for general adult medical examination without abnormal findings: Secondary | ICD-10-CM

## 2015-12-15 DIAGNOSIS — Z1231 Encounter for screening mammogram for malignant neoplasm of breast: Secondary | ICD-10-CM | POA: Diagnosis present

## 2015-12-15 DIAGNOSIS — M810 Age-related osteoporosis without current pathological fracture: Secondary | ICD-10-CM | POA: Diagnosis not present

## 2015-12-15 LAB — HM MAMMOGRAPHY

## 2015-12-16 NOTE — Progress Notes (Signed)
Has been abstracted.

## 2015-12-18 ENCOUNTER — Emergency Department
Admission: EM | Admit: 2015-12-18 | Discharge: 2015-12-18 | Disposition: A | Discharge status: Home / Self Care | Payer: Medicare Other | Attending: Student | Admitting: Student

## 2015-12-18 ENCOUNTER — Encounter: Payer: Self-pay | Admitting: Family

## 2015-12-18 DIAGNOSIS — Z5181 Encounter for therapeutic drug level monitoring: Secondary | ICD-10-CM | POA: Insufficient documentation

## 2015-12-18 DIAGNOSIS — K148 Other diseases of tongue: Secondary | ICD-10-CM

## 2015-12-18 DIAGNOSIS — R32 Unspecified urinary incontinence: Secondary | ICD-10-CM | POA: Diagnosis not present

## 2015-12-18 DIAGNOSIS — R197 Diarrhea, unspecified: Secondary | ICD-10-CM | POA: Diagnosis present

## 2015-12-18 DIAGNOSIS — K1329 Other disturbances of oral epithelium, including tongue: Secondary | ICD-10-CM | POA: Insufficient documentation

## 2015-12-18 DIAGNOSIS — Z79899 Other long term (current) drug therapy: Secondary | ICD-10-CM | POA: Diagnosis not present

## 2015-12-18 DIAGNOSIS — R112 Nausea with vomiting, unspecified: Secondary | ICD-10-CM | POA: Insufficient documentation

## 2015-12-18 LAB — COMPREHENSIVE METABOLIC PANEL
ALBUMIN: 5.2 g/dL — AB (ref 3.5–5.0)
ALT: 36 U/L (ref 14–54)
AST: 46 U/L — AB (ref 15–41)
Alkaline Phosphatase: 106 U/L (ref 38–126)
Anion gap: 9 (ref 5–15)
BILIRUBIN TOTAL: 1.5 mg/dL — AB (ref 0.3–1.2)
BUN: 11 mg/dL (ref 6–20)
CHLORIDE: 106 mmol/L (ref 101–111)
CO2: 26 mmol/L (ref 22–32)
CREATININE: 0.89 mg/dL (ref 0.44–1.00)
Calcium: 10.1 mg/dL (ref 8.9–10.3)
GFR calc Af Amer: >60 mL/min (ref >60)
GLUCOSE: 111 mg/dL — AB (ref 65–99)
POTASSIUM: 4 mmol/L (ref 3.5–5.1)
Sodium: 141 mmol/L (ref 135–145)
TOTAL PROTEIN: 8.4 g/dL — AB (ref 6.5–8.1)

## 2015-12-18 LAB — URINALYSIS COMPLETE WITH MICROSCOPIC (ARMC ONLY)
BACTERIA UA: NONE SEEN
Bilirubin Urine: NEGATIVE
Glucose, UA: NEGATIVE mg/dL
HGB URINE DIPSTICK: NEGATIVE
Ketones, ur: NEGATIVE mg/dL
Leukocytes, UA: NEGATIVE
Nitrite: NEGATIVE
PH: 6 (ref 5.0–8.0)
PROTEIN: NEGATIVE mg/dL
Specific Gravity, Urine: 1.017 (ref 1.005–1.030)

## 2015-12-18 LAB — PROTIME-INR
INR: 0.95
PROTHROMBIN TIME: 12.7 s (ref 11.4–15.2)

## 2015-12-18 LAB — CBC WITH DIFFERENTIAL/PLATELET
Basophils Absolute: 0 10*3/uL (ref 0–0.1)
Basophils Relative: 0 %
EOS ABS: 0 10*3/uL (ref 0–0.7)
EOS PCT: 0 %
HCT: 49 % — ABNORMAL HIGH (ref 35.0–47.0)
Hemoglobin: 16.9 g/dL — ABNORMAL HIGH (ref 12.0–16.0)
LYMPHS ABS: 0.8 10*3/uL — AB (ref 1.0–3.6)
LYMPHS PCT: 5 %
MCH: 30.9 pg (ref 26.0–34.0)
MCHC: 34.5 g/dL (ref 32.0–36.0)
MCV: 89.5 fL (ref 80.0–100.0)
MONO ABS: 0.3 10*3/uL (ref 0.2–0.9)
Monocytes Relative: 2 %
Neutro Abs: 12.8 10*3/uL — ABNORMAL HIGH (ref 1.4–6.5)
Neutrophils Relative %: 93 %
PLATELETS: 217 10*3/uL (ref 150–440)
RBC: 5.47 MIL/uL — AB (ref 3.80–5.20)
RDW: 13.1 % (ref 11.5–14.5)
WBC: 13.9 10*3/uL — AB (ref 3.6–11.0)

## 2015-12-18 LAB — APTT: aPTT: 31 seconds (ref 24–36)

## 2015-12-18 MED ORDER — ONDANSETRON HCL 4 MG PO TABS: 4.0000 mg | ORAL_TABLET | Freq: Three times a day (TID) | ORAL | 0 refills | Status: DC | PRN

## 2015-12-18 MED ORDER — ONDANSETRON 4 MG PO TBDP
4.0000 mg | ORAL_TABLET | Freq: Once | ORAL | Status: AC
  Administered 2015-12-18: 4 mg via ORAL
  Filled 2015-12-18: qty 1

## 2015-12-18 NOTE — ED Notes (Addendum)
Pt c/o nausea, vomiting, diarrhea, dizziness, headache, chills; most concern with swollen non-painful tongue that is red and purple on the edges. Noticed this AM around 4.  No difficulty breathing or swallowing.

## 2015-12-18 NOTE — ED Provider Notes (Signed)
[facility] Emergency Department Provider Note  ____________________________________________  Time seen: Approximately 10:33 AM  I have reviewed the triage vital signs and the nursing notes.   HISTORY  Chief Complaint Diarrhea    HPI Samantha Ellison is a 65 y.o. female , NAD, presents to the emergency department accompanied by her husband who assists with history. States she woke this morning around 4 AM and had onset of emesis and watery diarrhea. Has had 2 episodes since then. Her husband noted that the tip of her tongue was black which prompted them to come to the emergency department. Patient denies any injury or trauma to the mouth, face or neck. No falls or injuries. Denies retching with the episodes of emesis. Does not have history of seizure disorder. Her husband notes that she "sleeps funny". She does believe she has sleep apnea but has not been assessed due to a significant claustrophobia. She does not believe should be aware the CPAP mask. Denies abdominal pain, changes in urinary habits. Has had no chest pain, shortness of breath. Denies any recent antibiotic use. No known sick contacts. Has not had any fevers, chills, body aches. No changes in speech or gait. Demeanor has been normal per the husband.   Past Medical History:  Diagnosis Date  . Arthritis   . Arthritis of right knee   . Bilateral ankle fractures   . Fibrocystic breast disease   . History of chicken pox     Patient Active Problem List   Diagnosis Date Noted  . Elevated BP 11/16/2015  . Rash and nonspecific skin eruption 02/09/2015  . Cat scratch 02/04/2015  . Routine general medical examination at a health care facility 10/28/2014  . Obesity (BMI 30-39.9) 03/29/2014  . Arthritis of right hip 11/09/2013  . Medicare annual wellness visit, initial 08/27/2013  . Anxiety state 08/27/2013  . Osteopenia 07/27/2013    Past Surgical History:  Procedure Laterality Date  . DILATION AND  CURETTAGE OF UTERUS    . VAGINAL DELIVERY      Prior to Admission medications   Medication Sig Start Date End Date Taking? Authorizing Provider  BIOTIN 5000 PO Take 5,000 mcg by mouth once.    Historical Provider, MD  Calcium Carb-Cholecalciferol (CALCIUM + D3 PO) Take 1,200 Units by mouth 2 (two) times daily.    Historical Provider, MD  Cholecalciferol (VITAMIN D-3 PO) Take 1,000 Units by mouth once.    Historical Provider, MD  Lactobacillus (PROBIOTIC ACIDOPHILUS PO) Take 100 % by mouth once.    Historical Provider, MD  mupirocin cream (BACTROBAN) 2 % Apply 1 application topically 2 (two) times daily. To area of rash 02/16/15   Shelia Media, MD  ondansetron (ZOFRAN) 4 MG tablet Take 1 tablet (4 mg total) by mouth every 8 (eight) hours as needed for nausea or vomiting. 12/18/15 12/25/15  Vaida Kerchner L Talley Casco, PA-C  sertraline (ZOLOFT) 50 MG tablet Take 1 tablet (50 mg total) by mouth at bedtime. 11/16/15   Allegra Grana, FNP  simvastatin (ZOCOR) 20 MG tablet Take 1 tablet (20 mg total) by mouth every evening. 11/17/15   Allegra Grana, FNP  TURMERIC PO Take 300 mg by mouth 2 (two) times daily.    Historical Provider, MD    Allergies Compazine [prochlorperazine edisylate]  Family History  Problem Relation Age of Onset  . Hyperlipidemia Mother   . Hypertension Mother   . COPD Mother   . Aneurysm Mother   . Hypertension Father   .  COPD Father   . Heart disease Brother     CAD s/p stents    Social History Social History  Substance Use Topics  . Smoking status: Never Smoker  . Smokeless tobacco: Never Used  . Alcohol use 0.6 oz/week    1 Glasses of wine per week     Comment: Social drink     Review of Systems  Constitutional: No fever/chills Eyes: No visual changes.  ENT: Positive discolored tongue with numbness. No sore throat, ear pain, nasal congestion. Cardiovascular: No chest pain. Respiratory: No cough, chest congestion. No shortness of breath. No wheezing.   Gastrointestinal: Positive nausea, vomiting, diarrhea. No abdominal pain. No constipation. Genitourinary: Positive urinary incontinence x 1. No dysuria, hematuria, pelvic pain. No urinary hesitancy, urgency or increased frequency. Musculoskeletal: Negative for general myalgias.  Skin: Negative for rash. Neurological: Negative for focal weakness or numbness. No tingling. No changes in speech or gait. No demeanor changes. No saddle paraesthesias.  10-point ROS otherwise negative.  ____________________________________________   PHYSICAL EXAM:  VITAL SIGNS: ED Triage Vitals  Enc Vitals Group     BP 12/18/15 1011 (!) 162/99     Pulse Rate 12/18/15 1011 82     Resp 12/18/15 1011 15     Temp 12/18/15 1011 98.2 F (36.8 C)     Temp Source 12/18/15 1011 Oral     SpO2 12/18/15 1011 98 %     Weight 12/18/15 1013 190 lb (86.2 kg)     Height 12/18/15 1013 5\' 5"  (1.651 m)     Head Circumference --      Peak Flow --      Pain Score --      Pain Loc --      Pain Edu? --      Excl. in GC? --      Constitutional: Alert and oriented. Well appearing and in no acute distress. Eyes: Conjunctivae are normal without icterus, injection, hemorrhage. PERRLA. EOMI without pain.  Head: Atraumatic. ENT:      Ears: External ear canals without erythema, swelling, discharge.      Nose: No congestion/rhinnorhea.      Mouth/Throat: Distal portion of the tongue and edges with ecchymosis and petechia. No active bleeding, oozing or weeping. No open wound or sores. No lacerations. Uvula is midline. Pharynx without erythema, swelling, exudate. Good dentition throughout. Airway is patent. No petechia noted about the buccal mucosa. Mucous membranes are moist.  Neck: No stridor. Supple with full range of motion. Hematological/Lymphatic/Immunilogical: No cervical lymphadenopathy. Cardiovascular: Normal rate, regular rhythm. Normal S1 and S2.  Good peripheral circulation. Respiratory: Normal respiratory effort  without tachypnea or retractions. Lungs CTAB with breath sounds noted in all lung fields. Gastrointestinal: Soft and nontender without distention or guarding in all quadrants. No rigidity or rebound. Bowel sounds grossly normal active in all quadrants. Musculoskeletal: No lower extremity tenderness nor edema.  No joint effusions. Neurologic:  Normal speech and language. No gross focal neurologic deficits are appreciated. Cranial nerves III through XII grossly intact. Normal gait and posture.  Skin:  Skin is warm, dry and intact. No rash noted. Psychiatric: Mood and affect are normal. Speech and behavior are normal. Patient exhibits appropriate insight and judgement.   ____________________________________________   LABS (all labs ordered are listed, but only abnormal results are displayed)  Labs Reviewed  COMPREHENSIVE METABOLIC PANEL - Abnormal; Notable for the following:       Result Value   Glucose, Bld 111 (*)    Total  Protein 8.4 (*)    Albumin 5.2 (*)    AST 46 (*)    Total Bilirubin 1.5 (*)    All other components within normal limits  CBC WITH DIFFERENTIAL/PLATELET - Abnormal; Notable for the following:    WBC 13.9 (*)    RBC 5.47 (*)    Hemoglobin 16.9 (*)    HCT 49.0 (*)    Neutro Abs 12.8 (*)    Lymphs Abs 0.8 (*)    All other components within normal limits  URINALYSIS COMPLETEWITH MICROSCOPIC ([facility] ONLY) - Abnormal; Notable for the following:    Color, Urine YELLOW (*)    APPearance TURBID (*)    Squamous Epithelial / LPF 0-5 (*)    All other components within normal limits  PROTIME-INR  APTT   ____________________________________________  EKG  EKG with normal sinus rhythm with a ventricular rate of 69 bpm. No acute changes or evidence of STEMI. EKG also reviewed by Dr. Governor Rooks. ____________________________________________  RADIOLOGY  None ____________________________________________    PROCEDURES  Procedure(s) performed:  None   Procedures   Medications  ondansetron (ZOFRAN-ODT) disintegrating tablet 4 mg (4 mg Oral Given 12/18/15 1125)     ____________________________________________   INITIAL IMPRESSION / ASSESSMENT AND PLAN / ED COURSE  Pertinent labs & imaging results that were available during my care of the patient were reviewed by me and considered in my medical decision making (see chart for details).  Clinical Course  Comment By Time  Dr. Inocencio Homes has assessed the patient and requested urinalysis. She does not believe the discoloration of the tongue is life-threatening and the clinical picture is consistent with viral illness. It is suggested that the patient follow up with her primary care provider tomorrow for further evaluation of the discolored tongue that may include referral to ENT. We will keep the patient in the emergency department over the next hour to observe and ensure that her tongue is not swelling. Hope Pigeon, PA-C 10/01 1222  Patient has had no worsening of swelling or discoloration of the tongue over the last 90 minutes. She notes that her headache has lessened. Discussed with patient that her symptoms could be related to a B vitamin deficiency and that she should follow-up with her primary care provider at [facility] health care tomorrow for further evaluation and treatment. Patient also advised to take her medications as previous and prescribed daily. I have relayed this information to Dr. Inocencio Homes who is in agreement to discharge the patient home with good follow-up with her primary care provider. Hope Pigeon, PA-C 10/01 1356    Patient's diagnosis is consistent with Non-intractable vomiting with nausea, diarrhea and tongue discoloration. Anticipate nausea, vomiting and diarrhea related to viral gastroenteritis as patient's clinical picture is consistent with such. Tongue discoloration may be due to B complex deficiency in which patient has not been compliant with medication regimen  for such. Patient is encouraged to take her previously prescribed medications as directed and avoid missing any days. Patient will be discharged home with prescriptions for Zofran to take as needed for nausea or vomiting. Patient is to follow up with her primary care provider tomorrow for further evaluation and treatment. Patient is given ED precautions to return to the ED for any worsening or new symptoms.    ____________________________________________  FINAL CLINICAL IMPRESSION(S) / ED DIAGNOSES  Final diagnoses:  Non-intractable vomiting with nausea, unspecified vomiting type  Diarrhea in adult patient  Tongue discoloration      NEW MEDICATIONS STARTED  DURING THIS VISIT:  New Prescriptions   ONDANSETRON (ZOFRAN) 4 MG TABLET    Take 1 tablet (4 mg total) by mouth every 8 (eight) hours as needed for nausea or vomiting.         Hope PigeonJami L Jeronimo Hellberg, PA-C 12/18/15 1403    Gayla DossEryka A Gayle, MD 12/18/15 [phone]

## 2015-12-18 NOTE — Discharge Instructions (Signed)
Please call your primary care provider tomorrow to schedule a follow up appointment.

## 2015-12-18 NOTE — ED Triage Notes (Addendum)
Pt with watery stool x 2 since 4am. Also with watery emesis x 2. Tongue is also black.

## 2015-12-21 ENCOUNTER — Encounter: Payer: Self-pay | Admitting: Family

## 2015-12-21 ENCOUNTER — Ambulatory Visit (INDEPENDENT_AMBULATORY_CARE_PROVIDER_SITE_OTHER): Payer: Medicare Other | Admitting: Family

## 2015-12-21 VITALS — BP 146/98 | HR 75 | Temp 98.1°F | Wt 189.4 lb

## 2015-12-21 DIAGNOSIS — R32 Unspecified urinary incontinence: Secondary | ICD-10-CM | POA: Diagnosis not present

## 2015-12-21 DIAGNOSIS — F40240 Claustrophobia: Secondary | ICD-10-CM | POA: Diagnosis not present

## 2015-12-21 DIAGNOSIS — K148 Other diseases of tongue: Secondary | ICD-10-CM

## 2015-12-21 MED ORDER — ALPRAZOLAM 0.5 MG PO TABS: ORAL_TABLET | ORAL | 0 refills | Status: DC

## 2015-12-21 NOTE — Assessment & Plan Note (Addendum)
Working diagnoses remain quite broad at this time. Reassured by normal neurologic exam and the absence of any dysphagia, swelling of the tongue. I'm considering sleep apnea as patient's husband describes snoring and apneic episodes for many years. I'm also considering possible seizure as patient may clenched down on tongue causing the bruising patient had when she presented to emergency room. today I can see indentation on the sides of the tongue which may have been from teeth Marks. concern as patient had episode of urinary incontinence and some confusion noted by husband during this episode. Canceled MRIof brain and ordered CT head due to patients severe claustrophobia, referral to neurology for possible seizure evaluation. Also pending additional lab work for B12 deficiency. Sleep study ordered.

## 2015-12-21 NOTE — Progress Notes (Signed)
Subjective:    Patient ID: Samantha Ellison, female    DOB: [birth year], 65 y.o.   MRN: [id number]  CC: Samantha Ellison is a 65 y.o. female who presents today for follow up.   HPI: Patient here for follow-up after emergency room visit for "bruised tongue". Accompanied by husband who thinks that is may have been a seizure 3 days ago in the morning. He is also concerned it may have been a 'choking episode' or 'sleep apnea related.' Patient snores and husband hears her stop breathing, gasping.   Husband states episode started when she got OOB after wetting the bed, struggled to get to bathroom and started vomiting, clear. Husband describes her as being disoriented. Tongue was slightly swollen and husband thought speech was slurred so husband want to her to ER. No convulsing or shaking seen by husband. Patient doesn't recall wetting the bed.    Diarrhea and vomiting resolved the same day. No SOB, trouble swallowing, dysphagia.   No food triggers/allergies to patient's knowledge.  Patient also noted that patient has recently become vegan.   10/1 ED suspected glossitis due to Vitamin B or vitamin deficiency. She also had diarrhea at that time thought to be viral gastritis.  HISTORY:  Past Medical History:  Diagnosis Date  . Arthritis   . Arthritis of right knee   . Bilateral ankle fractures   . Fibrocystic breast disease   . History of chicken pox    Past Surgical History:  Procedure Laterality Date  . DILATION AND CURETTAGE OF UTERUS    . VAGINAL DELIVERY     Family History  Problem Relation Age of Onset  . Hyperlipidemia Mother   . Hypertension Mother   . COPD Mother   . Aneurysm Mother   . Hypertension Father   . COPD Father   . Heart disease Brother     CAD s/p stents    Allergies: Compazine [prochlorperazine edisylate] Current Outpatient Prescriptions on File Prior to Visit  Medication Sig Dispense Refill  . Calcium Carb-Cholecalciferol (CALCIUM + D3 PO) Take 1,200 Units  by mouth 2 (two) times daily.    . Cholecalciferol (VITAMIN D-3 PO) Take 1,000 Units by mouth once.    . Lactobacillus (PROBIOTIC ACIDOPHILUS PO) Take 100 % by mouth once.    . mupirocin cream (BACTROBAN) 2 % Apply 1 application topically 2 (two) times daily. To area of rash 30 g 0  . simvastatin (ZOCOR) 20 MG tablet Take 1 tablet (20 mg total) by mouth every evening. 90 tablet 3  . TURMERIC PO Take 300 mg by mouth 2 (two) times daily.     No current facility-administered medications on file prior to visit.     Social History  Substance Use Topics  . Smoking status: Never Smoker  . Smokeless tobacco: Never Used  . Alcohol use 0.6 oz/week    1 Glasses of wine per week     Comment: Social drink    Review of Systems  Constitutional: Negative for chills and fever.  HENT: Negative for trouble swallowing.   Respiratory: Negative for cough, shortness of breath and wheezing.   Cardiovascular: Negative for chest pain and palpitations.  Gastrointestinal: Negative for nausea and vomiting.      Objective:    BP (!) 146/98   Pulse 75   Temp 98.1 F (36.7 C) (Oral)   Wt 189 lb 6.4 oz (85.9 kg)   LMP  (LMP Unknown)   SpO2 98%   BMI 31.52 kg/m  BP Readings from Last 3 Encounters:  12/21/15 (!) 146/98  12/18/15 (!) 156/76  11/16/15 (!) 148/82   Wt Readings from Last 3 Encounters:  12/21/15 189 lb 6.4 oz (85.9 kg)  12/18/15 190 lb (86.2 kg)  11/16/15 192 lb 9.6 oz (87.4 kg)    Physical Exam  Constitutional: She appears well-developed and well-nourished.  HENT:  Mouth/Throat: Uvula is midline, oropharynx is clear and moist and mucous membranes are normal. She does not have dentures. No oral lesions. No trismus in the jaw. No dental abscesses or uvula swelling. No posterior oropharyngeal erythema.  No Hyper erythematous, ecchymotic areas appreciated on tongue. Yellow as film on dorsal aspect of tongue. No masses appreciated with palpation of tongue or gums. Left lateral side of  tongue appears to have indentation. See attached images.  Eyes: Conjunctivae and EOM are normal. Pupils are equal, round, and reactive to light.  Fundus normal bilaterally.   Cardiovascular: Normal rate, regular rhythm, normal heart sounds and normal pulses.   Pulmonary/Chest: Effort normal and breath sounds normal. She has no wheezes. She has no rhonchi. She has no rales.  Neurological: She is alert. She has normal strength. No cranial nerve deficit or sensory deficit. She displays a negative Romberg sign.  Reflex Scores:      Bicep reflexes are 2+ on the right side and 2+ on the left side.      Patellar reflexes are 2+ on the right side and 2+ on the left side. Grip equal and strong bilateral upper extremities. Gait strong and steady. Able to perform rapid alternating movement without difficulty.   Skin: Skin is warm and dry.  Psychiatric: She has a normal mood and affect. Her speech is normal and behavior is normal. Thought content normal.  Vitals reviewed.         Assessment & Plan:   Problem List Items Addressed This Visit      Digestive   Discoloration of tongue - Primary    Working diagnoses remain quite broad at this time. Reassured by normal neurologic exam and the absence of any dysphagia, swelling of the tongue. I'm considering sleep apnea as patient's husband describes snoring and apneic episodes for many years. I'm also considering possible seizure as patient may clenched down on tongue causing the bruising patient had when she presented to emergency room. today I can see indentation on the sides of the tongue which may have been from teeth Marks. concern as patient had episode of urinary incontinence and some confusion noted by husband during this episode. Canceled MRIof brain and ordered CT head due to patients severe claustrophobia, referral to neurology for possible seizure evaluation. Also pending additional lab work for B12 deficiency. Sleep study ordered.      Relevant  Orders   Ambulatory referral to Sleep Studies   B12 and Folate Panel   Homocysteine   IBC panel   Methylmalonic acid, serum   CT Head Wo Contrast    Other Visit Diagnoses    Urinary incontinence, unspecified type       Relevant Orders   Ambulatory referral to Neurology   CT Head Wo Contrast   Claustrophobia       Relevant Medications   ALPRAZolam (XANAX) 0.5 MG tablet       I have discontinued Samantha Ellison's BIOTIN 5000 PO, sertraline, and ondansetron. I am also having her start on ALPRAZolam. Additionally, I am having her maintain her Calcium Carb-Cholecalciferol (CALCIUM + D3 PO), Lactobacillus (PROBIOTIC ACIDOPHILUS PO), TURMERIC PO,  Cholecalciferol (VITAMIN D-3 PO), mupirocin cream, and simvastatin.   Meds ordered this encounter  Medications  . ALPRAZolam (XANAX) 0.5 MG tablet    Sig: Take 1 tablet by mouth once 30 minutes prior to MRI.    Dispense:  1 tablet    Refill:  0    Order Specific Question:   Supervising Provider    Answer:   Sherlene Shams [[id number]]    Return precautions given.   Risks, benefits, and alternatives of the medications and treatment plan prescribed today were discussed, and patient expressed understanding.   Education regarding symptom management and diagnosis given to patient on AVS.  Continue to follow with Wynona Dove, MD for routine health maintenance.   Samantha Settler and I agreed with plan.   Rennie Plowman, FNP

## 2015-12-21 NOTE — Patient Instructions (Signed)
Right now, your symptoms are nonspecific. Pending laboratory workup, MRI and referral to neurology for further evaluation of possible seizure. Also pending a sleep study for sleep apnea.  If there is no improvement in your symptoms, or if there is any worsening of symptoms, or if you have any additional concerns, please return for re-evaluation; or, if we are closed, consider going to the Emergency Room for evaluation if symptoms urgent.

## 2015-12-21 NOTE — Progress Notes (Signed)
Pre visit review using our clinic review tool, if applicable. No additional management support is needed unless otherwise documented below in the visit note. 

## 2015-12-23 ENCOUNTER — Ambulatory Visit
Admission: RE | Admit: 2015-12-23 | Discharge: 2015-12-23 | Disposition: A | Discharge status: Home / Self Care | Payer: Medicare Other | Source: Ambulatory Visit | Attending: Family | Admitting: Family

## 2015-12-23 DIAGNOSIS — R32 Unspecified urinary incontinence: Secondary | ICD-10-CM | POA: Diagnosis not present

## 2015-12-23 DIAGNOSIS — K148 Other diseases of tongue: Secondary | ICD-10-CM

## 2015-12-27 ENCOUNTER — Ambulatory Visit: Payer: Medicare PPO

## 2015-12-28 ENCOUNTER — Encounter: Payer: Self-pay | Admitting: Family

## 2015-12-29 ENCOUNTER — Encounter: Payer: Self-pay | Admitting: Family

## 2016-01-03 ENCOUNTER — Other Ambulatory Visit (INDEPENDENT_AMBULATORY_CARE_PROVIDER_SITE_OTHER): Payer: Medicare Other

## 2016-01-03 DIAGNOSIS — K148 Other diseases of tongue: Secondary | ICD-10-CM

## 2016-01-03 LAB — B12 AND FOLATE PANEL: VITAMIN B 12: 349 pg/mL (ref 211–911)

## 2016-01-03 LAB — IBC PANEL
Iron: 70 ug/dL (ref 42–145)
SATURATION RATIOS: 18.6 % — AB (ref 20.0–50.0)
Transferrin: 269 mg/dL (ref 212.0–360.0)

## 2016-01-03 NOTE — Addendum Note (Signed)
Addended by: Warden FillersWRIGHT, Shadai Mcclane S on: 01/03/2016 11:10 AM   Modules accepted: Orders

## 2016-01-04 LAB — HOMOCYSTEINE: Homocysteine: 12.8 umol/L — ABNORMAL HIGH (ref <10.4)

## 2016-01-05 ENCOUNTER — Telehealth: Payer: Self-pay | Admitting: Family

## 2016-01-05 LAB — METHYLMALONIC ACID, SERUM: Methylmalonic Acid, Quant: 201 nmol/L (ref 87–318)

## 2016-01-05 NOTE — Telephone Encounter (Signed)
Spoken to patient and informed her that results have not been interpreted.

## 2016-01-05 NOTE — Telephone Encounter (Signed)
Pt called looking for lab results. Thank you!   Call pt  @ [phone]

## 2016-01-06 ENCOUNTER — Encounter: Payer: Self-pay | Admitting: Family

## 2016-01-06 ENCOUNTER — Ambulatory Visit: Payer: Medicare Other | Attending: Otolaryngology

## 2016-01-06 DIAGNOSIS — R0683 Snoring: Secondary | ICD-10-CM | POA: Diagnosis present

## 2016-01-26 ENCOUNTER — Encounter: Payer: Self-pay | Admitting: Family

## 2016-01-26 ENCOUNTER — Ambulatory Visit: Payer: Medicare Other | Admitting: Family

## 2016-01-26 ENCOUNTER — Ambulatory Visit (INDEPENDENT_AMBULATORY_CARE_PROVIDER_SITE_OTHER): Payer: Medicare Other | Admitting: Family

## 2016-01-26 VITALS — BP 132/90 | HR 65 | Temp 98.2°F | Ht 65.0 in | Wt 190.6 lb

## 2016-01-26 DIAGNOSIS — E785 Hyperlipidemia, unspecified: Secondary | ICD-10-CM | POA: Diagnosis not present

## 2016-01-26 DIAGNOSIS — F411 Generalized anxiety disorder: Secondary | ICD-10-CM

## 2016-01-26 DIAGNOSIS — K148 Other diseases of tongue: Secondary | ICD-10-CM | POA: Diagnosis not present

## 2016-01-26 LAB — CBC WITH DIFFERENTIAL/PLATELET
BASOS PCT: 0.8 % (ref 0.0–3.0)
Basophils Absolute: 0 10*3/uL (ref 0.0–0.1)
EOS ABS: 0.1 10*3/uL (ref 0.0–0.7)
EOS PCT: 2.1 % (ref 0.0–5.0)
HCT: 43.2 % (ref 36.0–46.0)
HEMOGLOBIN: 14.6 g/dL (ref 12.0–15.0)
LYMPHS ABS: 1.6 10*3/uL (ref 0.7–4.0)
Lymphocytes Relative: 30.8 % (ref 12.0–46.0)
MCHC: 33.8 g/dL (ref 30.0–36.0)
MCV: 89.1 fl (ref 78.0–100.0)
MONO ABS: 0.5 10*3/uL (ref 0.1–1.0)
Monocytes Relative: 10.4 % (ref 3.0–12.0)
NEUTROS ABS: 2.9 10*3/uL (ref 1.4–7.7)
Neutrophils Relative %: 55.9 % (ref 43.0–77.0)
PLATELETS: 226 10*3/uL (ref 150.0–400.0)
RBC: 4.85 Mil/uL (ref 3.87–5.11)
RDW: 13.1 % (ref 11.5–15.5)
WBC: 5.2 10*3/uL (ref 4.0–10.5)

## 2016-01-26 MED ORDER — ALPRAZOLAM 0.5 MG PO TABS: 0.5000 mg | ORAL_TABLET | Freq: Every day | ORAL | 0 refills | Status: DC | PRN

## 2016-01-26 NOTE — Patient Instructions (Signed)
Pleasure seeing you   

## 2016-01-26 NOTE — Assessment & Plan Note (Signed)
D/ced statin. Trial of lifestyle modification. Education provided. Will follow.

## 2016-01-26 NOTE — Assessment & Plan Note (Signed)
No further episodes. No OSA. Advised weight loss per sleep study report to help with snoring.

## 2016-01-26 NOTE — Assessment & Plan Note (Signed)
Patient agrees to trial zoloft for GAD and have small number of xanax tablets on hand to use very sparingly. Discussed risks of BZDs and patient agreed with long term plan of decreasing/discontinuing. She was most concerned with potential memory changes on BZDs. F.u 3 months.

## 2016-01-26 NOTE — Progress Notes (Signed)
Pre visit review using our clinic review tool, if applicable. No additional management support is needed unless otherwise documented below in the visit note. 

## 2016-01-26 NOTE — Progress Notes (Signed)
Subjective:    Patient ID: Samantha Ellison, female    DOB: [birth year], 65 y.o.   MRN: [id number]  CC: Samantha Ellison is a 65 y.o. female who presents today for follow up.   HPI: Patient here for follow up to discuss sleep study results. She would also like to repeat labs. No further episodes of biting tongue. Awaiting neurology appointment in one month.   Doesn't want to take statin anymore and would like to try lifestyle modifications.  Anxiety-continues to have situational anxiety. Primarily when she is in small places especially when riding in car with husband driving. She does not use xanax when she is driving vehicle. willing to try Zoloft as prescribed at last visit     HISTORY:  Past Medical History:  Diagnosis Date  . Arthritis   . Arthritis of right knee   . Bilateral ankle fractures   . Fibrocystic breast disease   . History of chicken pox    Past Surgical History:  Procedure Laterality Date  . DILATION AND CURETTAGE OF UTERUS    . VAGINAL DELIVERY     Family History  Problem Relation Age of Onset  . Hyperlipidemia Mother   . Hypertension Mother   . COPD Mother   . Aneurysm Mother   . Hypertension Father   . COPD Father   . Heart disease Brother     CAD s/p stents    Allergies: Compazine [prochlorperazine edisylate] Current Outpatient Prescriptions on File Prior to Visit  Medication Sig Dispense Refill  . Calcium Carb-Cholecalciferol (CALCIUM + D3 PO) Take 1,200 Units by mouth 2 (two) times daily.    . Cholecalciferol (VITAMIN D-3 PO) Take 1,000 Units by mouth once.    . Lactobacillus (PROBIOTIC ACIDOPHILUS PO) Take 100 % by mouth once.    . mupirocin cream (BACTROBAN) 2 % Apply 1 application topically 2 (two) times daily. To area of rash 30 g 0  . TURMERIC PO Take 300 mg by mouth 2 (two) times daily.     No current facility-administered medications on file prior to visit.     Social History  Substance Use Topics  . Smoking status: Never Smoker  .  Smokeless tobacco: Never Used  . Alcohol use 0.6 oz/week    1 Glasses of wine per week     Comment: Social drink    Review of Systems  Constitutional: Negative for chills and fever.  Respiratory: Negative for cough.   Cardiovascular: Negative for chest pain and palpitations.  Gastrointestinal: Negative for nausea and vomiting.  Psychiatric/Behavioral: The patient is nervous/anxious.       Objective:    BP 132/90 (BP Location: Right Arm, Patient Position: Sitting, Cuff Size: Normal)   Pulse 65   Temp 98.2 F (36.8 C) (Oral)   Ht 5\' 5"  (1.651 m)   Wt 190 lb 9.6 oz (86.5 kg)   LMP  (LMP Unknown)   SpO2 98%   BMI 31.72 kg/m  BP Readings from Last 3 Encounters:  01/26/16 132/90  12/21/15 (!) 146/98  12/18/15 (!) 156/76   Wt Readings from Last 3 Encounters:  01/26/16 190 lb 9.6 oz (86.5 kg)  12/21/15 189 lb 6.4 oz (85.9 kg)  12/18/15 190 lb (86.2 kg)    Physical Exam  Constitutional: She appears well-developed and well-nourished.  Eyes: Conjunctivae are normal.  Cardiovascular: Normal rate, regular rhythm, normal heart sounds and normal pulses.   Pulmonary/Chest: Effort normal and breath sounds normal. She has no wheezes. She has  no rhonchi. She has no rales.  Neurological: She is alert.  Skin: Skin is warm and dry.  Psychiatric: She has a normal mood and affect. Her speech is normal and behavior is normal. Thought content normal.  Vitals reviewed.      Assessment & Plan:   Problem List Items Addressed This Visit      Digestive   Discoloration of tongue    No further episodes. No OSA. Advised weight loss per sleep study report to help with snoring.         Other   GAD (generalized anxiety disorder) - Primary    Patient agrees to trial zoloft for GAD and have small number of xanax tablets on hand to use very sparingly. Discussed risks of BZDs and patient agreed with long term plan of decreasing/discontinuing. She was most concerned with potential memory changes  on BZDs. F.u 3 months.        Relevant Medications   ALPRAZolam (XANAX) 0.5 MG tablet   HLD (hyperlipidemia)    D/ced statin. Trial of lifestyle modification. Education provided. Will follow.           I have discontinued Ms. Oliva's simvastatin. I have also changed her ALPRAZolam. Additionally, I am having her maintain her Calcium Carb-Cholecalciferol (CALCIUM + D3 PO), Lactobacillus (PROBIOTIC ACIDOPHILUS PO), TURMERIC PO, Cholecalciferol (VITAMIN D-3 PO), and mupirocin cream.   Meds ordered this encounter  Medications  . ALPRAZolam (XANAX) 0.5 MG tablet    Sig: Take 1 tablet (0.5 mg total) by mouth daily as needed for anxiety.    Dispense:  20 tablet    Refill:  0    Order Specific Question:   Supervising Provider    Answer:   Sherlene ShamsULLO, TERESA L [[id number]]    Return precautions given.   Risks, benefits, and alternatives of the medications and treatment plan prescribed today were discussed, and patient expressed understanding.   Education regarding symptom management and diagnosis given to patient on AVS.  Continue to follow with Rennie PlowmanMargaret Joud Pettinato, FNP for routine health maintenance.   Samantha Ellison and I agreed with plan.   Rennie PlowmanMargaret Kristeen Lantz, FNP  Total of 25 minutes spent with patient, greater than 50% of which was spent in discussion of  Anxiety and BZDs.

## 2016-02-27 ENCOUNTER — Encounter: Payer: Self-pay | Admitting: Emergency Medicine

## 2016-02-27 ENCOUNTER — Emergency Department
Admission: EM | Admit: 2016-02-27 | Discharge: 2016-02-27 | Disposition: A | Discharge status: Home / Self Care | Payer: Medicare Other | Attending: Emergency Medicine | Admitting: Emergency Medicine

## 2016-02-27 DIAGNOSIS — Y929 Unspecified place or not applicable: Secondary | ICD-10-CM | POA: Insufficient documentation

## 2016-02-27 DIAGNOSIS — Y999 Unspecified external cause status: Secondary | ICD-10-CM | POA: Insufficient documentation

## 2016-02-27 DIAGNOSIS — Z79899 Other long term (current) drug therapy: Secondary | ICD-10-CM | POA: Insufficient documentation

## 2016-02-27 DIAGNOSIS — R569 Unspecified convulsions: Secondary | ICD-10-CM | POA: Diagnosis present

## 2016-02-27 DIAGNOSIS — X58XXXA Exposure to other specified factors, initial encounter: Secondary | ICD-10-CM | POA: Diagnosis not present

## 2016-02-27 DIAGNOSIS — G40909 Epilepsy, unspecified, not intractable, without status epilepticus: Secondary | ICD-10-CM | POA: Diagnosis not present

## 2016-02-27 DIAGNOSIS — S00512A Abrasion of oral cavity, initial encounter: Secondary | ICD-10-CM | POA: Diagnosis not present

## 2016-02-27 DIAGNOSIS — Y939 Activity, unspecified: Secondary | ICD-10-CM | POA: Insufficient documentation

## 2016-02-27 HISTORY — DX: Unspecified convulsions: R56.9

## 2016-02-27 LAB — URINALYSIS, ROUTINE W REFLEX MICROSCOPIC
BILIRUBIN URINE: NEGATIVE
Glucose, UA: NEGATIVE mg/dL
HGB URINE DIPSTICK: NEGATIVE
Ketones, ur: NEGATIVE mg/dL
Leukocytes, UA: NEGATIVE
NITRITE: NEGATIVE
PROTEIN: NEGATIVE mg/dL
SPECIFIC GRAVITY, URINE: 1.014 (ref 1.005–1.030)
pH: 8 (ref 5.0–8.0)

## 2016-02-27 LAB — BASIC METABOLIC PANEL
Anion gap: 11 (ref 5–15)
BUN: 17 mg/dL (ref 6–20)
CHLORIDE: 105 mmol/L (ref 101–111)
CO2: 21 mmol/L — ABNORMAL LOW (ref 22–32)
CREATININE: 0.92 mg/dL (ref 0.44–1.00)
Calcium: 9.7 mg/dL (ref 8.9–10.3)
Glucose, Bld: 153 mg/dL — ABNORMAL HIGH (ref 65–99)
POTASSIUM: 4.2 mmol/L (ref 3.5–5.1)
SODIUM: 137 mmol/L (ref 135–145)

## 2016-02-27 LAB — CBC
HCT: 46.2 % (ref 35.0–47.0)
HEMOGLOBIN: 15.6 g/dL (ref 12.0–16.0)
MCH: 30.4 pg (ref 26.0–34.0)
MCHC: 33.8 g/dL (ref 32.0–36.0)
MCV: 89.8 fL (ref 80.0–100.0)
PLATELETS: 202 10*3/uL (ref 150–440)
RBC: 5.15 MIL/uL (ref 3.80–5.20)
RDW: 13.1 % (ref 11.5–14.5)
WBC: 9.4 10*3/uL (ref 3.6–11.0)

## 2016-02-27 MED ORDER — ACETAMINOPHEN 325 MG PO TABS
ORAL_TABLET | ORAL | Status: AC
  Administered 2016-02-27: 650 mg via ORAL
  Filled 2016-02-27: qty 2

## 2016-02-27 MED ORDER — LEVETIRACETAM 500 MG PO TABS
500.0000 mg | ORAL_TABLET | ORAL | Status: AC
  Administered 2016-02-27: 500 mg via ORAL

## 2016-02-27 MED ORDER — ACETAMINOPHEN 325 MG PO TABS
650.0000 mg | ORAL_TABLET | ORAL | Status: AC
  Administered 2016-02-27: 650 mg via ORAL

## 2016-02-27 MED ORDER — LEVETIRACETAM 500 MG PO TABS
ORAL_TABLET | ORAL | Status: AC
  Administered 2016-02-27: 500 mg via ORAL
  Filled 2016-02-27: qty 1

## 2016-02-27 MED ORDER — LEVETIRACETAM 500 MG PO TABS: 500.0000 mg | ORAL_TABLET | Freq: Two times a day (BID) | ORAL | 1 refills | Status: AC

## 2016-02-27 MED ORDER — LEVETIRACETAM 500 MG PO TABS
500.0000 mg | ORAL_TABLET | Freq: Once | ORAL | Status: AC
  Administered 2016-02-27: 500 mg via ORAL
  Filled 2016-02-27: qty 1

## 2016-02-27 NOTE — ED Triage Notes (Signed)
Per ems pt from home with seizure x2 since 0200. Pt does have small bleeding controlled laceration to right lateral tongue. Pt without loss of bladder or bowel. Skin pwd, resps unlabored. Pt is currently alert to self, place, unsure of situation and date. perrl 3mm and brisk. Pt denies pain, nausea, vomiting, fever.

## 2016-02-27 NOTE — ED Provider Notes (Signed)
[facility] Emergency Department Provider Note  Time seen: 5:49 AM  I have reviewed the triage vital signs and the nursing notes.   HISTORY  Chief Complaint Seizures    HPI Samantha Ellison is a 65 y.o. female presents to the emergency department after a possible seizure. According to the husband and daughter the patient has been having episodes for the past several months. Had one in October where she had shaking, bit her tongue and was postictal/confused. Patient came to the emergency department that time had a largely normal workup. Patient was referred to neurology. Had her first appointment 6 days ago, had an EEG (which has not yet resulted), and scheduled for an MRI. Husband states tonight patient were shaking, and then had a approximate 10 minute period where she was sleeping and not responding to him. They initially called EMS but refused transport. Proximally one hour later he states a similar event once again occurred were shaking and then 10-20 minutes of somnolence/confusion so he called EMS who brought her to the emergency department for evaluation. Patient states she feels back to normal at this time. She does not recall the shaking. Small tongue abrasion, denies any other complaints.  Past Medical History:  Diagnosis Date  . Arthritis   . Arthritis of right knee   . Bilateral ankle fractures   . Fibrocystic breast disease   . History of chicken pox   . Seizures [facility])     Patient Active Problem List   Diagnosis Date Noted  . HLD (hyperlipidemia) 01/26/2016  . Discoloration of tongue 12/21/2015  . Elevated BP 11/16/2015  . Rash and nonspecific skin eruption 02/09/2015  . Cat scratch 02/04/2015  . Routine general medical examination at a health care facility 10/28/2014  . Obesity (BMI 30-39.9) 03/29/2014  . Arthritis of right hip 11/09/2013  . Medicare annual wellness visit, initial 08/27/2013  . GAD (generalized anxiety disorder) 08/27/2013  .  Osteopenia 07/27/2013    Past Surgical History:  Procedure Laterality Date  . DILATION AND CURETTAGE OF UTERUS    . VAGINAL DELIVERY      Prior to Admission medications   Medication Sig Start Date End Date Taking? Authorizing Provider  ALPRAZolam Prudy Feeler(XANAX) 0.5 MG tablet Take 1 tablet (0.5 mg total) by mouth daily as needed for anxiety. 01/26/16   Allegra GranaMargaret G Arnett, FNP  Calcium Carb-Cholecalciferol (CALCIUM + D3 PO) Take 1,200 Units by mouth 2 (two) times daily.    Historical Provider, MD  Cholecalciferol (VITAMIN D-3 PO) Take 1,000 Units by mouth once.    Historical Provider, MD  Lactobacillus (PROBIOTIC ACIDOPHILUS PO) Take 100 % by mouth once.    Historical Provider, MD  mupirocin cream (BACTROBAN) 2 % Apply 1 application topically 2 (two) times daily. To area of rash 02/16/15   Shelia MediaJennifer A Walker, MD  sertraline (ZOLOFT) 50 MG tablet Take 50 mg by mouth at bedtime.    Historical Provider, MD  TURMERIC PO Take 300 mg by mouth 2 (two) times daily.    Historical Provider, MD    Allergies  Allergen Reactions  . Compazine [Prochlorperazine Edisylate] Other (See Comments)    Neck drew back per patient report. Muscle Tightness/spasms    Family History  Problem Relation Age of Onset  . Hyperlipidemia Mother   . Hypertension Mother   . COPD Mother   . Aneurysm Mother   . Hypertension Father   . COPD Father   . Heart disease Brother     CAD  s/p stents    Social History Social History  Substance Use Topics  . Smoking status: Never Smoker  . Smokeless tobacco: Never Used  . Alcohol use 0.6 oz/week    1 Glasses of wine per week     Comment: Social drink    Review of Systems Constitutional: Negative for fever. Cardiovascular: Negative for chest pain. Respiratory: Negative for shortness of breath. Gastrointestinal: Negative for abdominal pain Neurological: Negative for headaches, focal weakness or numbness. 10-point ROS otherwise  negative.  ____________________________________________   PHYSICAL EXAM:  VITAL SIGNS: ED Triage Vitals [02/27/16 0524]  Enc Vitals Group     BP (!) 164/86     Pulse Rate 99     Resp 14     Temp 98.5 F (36.9 C)     Temp Source Oral     SpO2 99 %     Weight 175 lb (79.4 kg)     Height 5\' 5"  (1.651 m)     Head Circumference      Peak Flow      Pain Score      Pain Loc      Pain Edu?      Excl. in GC?     Constitutional: Alert and oriented.  Eyes: Normal exam ENT   Head: Normocephalic and atraumatic.   Mouth/Throat: Mucous membranes are moist.Mild right-sided tongue abrasion.  Cardiovascular: Normal rate, regular rhythm. No murmur Respiratory: Normal respiratory effort without tachypnea nor retractions. Breath sounds are clear  Gastrointestinal: Soft and nontender. No distention.  Musculoskeletal: Nontender with normal range of motion in all extremities.  Neurologic:  Normal speech and language. No gross focal neurologic deficits. 5/5 motor in all extremities. Skin:  Skin is warm, dry and intact.  Psychiatric: Mood and affect are normal   ____________________________________________    EKG  EKG reviewed and interpreted, so shows normal sinus rhythm at 99 bpm, narrow QRS, normal axis, largely normal intervals with nonspecific but no concerning ST changes.  ____________________________________________   INITIAL IMPRESSION / ASSESSMENT AND PLAN / ED COURSE  Pertinent labs & imaging results that were available during my care of the patient were reviewed by me and considered in my medical decision making (see chart for details).  Patient presents the emergency department for seizure-like activity. Patient has been evaluated by neurology, has not been started on any antiepileptic medications. Currently awaiting EEG results and awaiting MRI to be performed. Currently the patient appears well, alert and oriented, no distress, no complaints. We will check basic labs,  if labs are within normal limits we will load her with Keppra and discharged with Keppra until she can follow-up with neurology regarding the EEG and MRI results. Patient is agreeable to this plan.  Patient remains well in the emergency department. Labs pending. Patient care signed out to oncoming physician.  ____________________________________________   FINAL CLINICAL IMPRESSION(S) / ED DIAGNOSES  Seizure    Minna AntisKevin Ramanda Paules, MD 02/27/16 [phone]

## 2016-02-27 NOTE — ED Notes (Signed)
Pt assisted to the bed w/o incident. Pt was able to get up and walk to the bathroom with assistance.

## 2016-02-27 NOTE — Discharge Instructions (Signed)
Please begin taking her medication, as prescribed. Please follow-up with Dr. Sherryll BurgerShah by calling the number provided to arrange a follow-up appointment as soon as possible. Please let him know of your recent ER visit. Return to the emergency department for any further seizure-like activity, or any other symptom personally concerning to your self.

## 2016-02-27 NOTE — ED Notes (Signed)
Pt was talking on phone, when this RN walked into room and offered medication, and went non verbal, starring off in space, and continuously spitting. MD to bedside for evaluation.

## 2016-02-27 NOTE — Progress Notes (Signed)
CH visited the husband of Pt in the Family Room. Pt was being assessed for a possible seizer. Husband was waiting for results. CH provided the ministry of empathetic listening and presence. CH is available for follow up as needed.    02/27/16 1100  Clinical Encounter Type  Visited With Family  Visit Type Initial;Spiritual support  Referral From Nurse  Spiritual Encounters  Spiritual Needs Emotional

## 2016-02-27 NOTE — ED Provider Notes (Signed)
Remainder of labs unremarkable. Patient given thousand milligrams by mouth Keppra in the ED, continue Keppra as prescribed by Dr. Lenard Lancepaduchowski, follow-up with Dr. Sherryll BurgerShah. No further significant events in the ED.   Sharman CheekPhillip Nayshawn Mesta, MD 02/27/16 1012

## 2016-03-01 ENCOUNTER — Telehealth: Payer: Self-pay | Admitting: *Deleted

## 2016-03-01 NOTE — Telephone Encounter (Signed)
Patient is scheduled for a MRI([facility]) on 12/18. Pt's husband stated that she will need a copy of her recent labs Husband Brett CanalesSteve [phone]

## 2016-03-01 NOTE — Telephone Encounter (Signed)
Patient s labs have been placed up front for husband to pick up.  Patient has been notified.

## 2016-04-20 ENCOUNTER — Ambulatory Visit (INDEPENDENT_AMBULATORY_CARE_PROVIDER_SITE_OTHER): Payer: Medicare Other

## 2016-04-20 VITALS — BP 136/76 | HR 68 | Temp 98.2°F | Resp 14 | Ht 65.0 in | Wt 192.4 lb

## 2016-04-20 DIAGNOSIS — Z0000 Encounter for general adult medical examination without abnormal findings: Secondary | ICD-10-CM | POA: Diagnosis not present

## 2016-04-20 NOTE — Progress Notes (Signed)
Subjective:   Samantha Ellison is a 66 y.o. female who presents for Medicare Annual (Subsequent) preventive examination.  Review of Systems:  No ROS.  Medicare Wellness Visit.  Cardiac Risk Factors include: advanced age (>49men, >60 women);hypertension;obesity (BMI >30kg/m2)      Objective:     Vitals: BP 136/76 (BP Location: Left Arm, Patient Position: Sitting, Cuff Size: Normal)   Pulse 68   Temp 98.2 F (36.8 C) (Oral)   Resp 14   Ht 5\' 5"  (1.651 m)   Wt 192 lb 6.4 oz (87.3 kg)   LMP  (LMP Unknown)   SpO2 97%   BMI 32.02 kg/m   Body mass index is 32.02 kg/m.   Tobacco History  Smoking Status  . Never Smoker  Smokeless Tobacco  . Never Used     Counseling given: Not Answered   Past Medical History:  Diagnosis Date  . Arthritis   . Arthritis of right knee   . Bilateral ankle fractures   . Fibrocystic breast disease   . History of chicken pox   . Seizures (HCC)    Past Surgical History:  Procedure Laterality Date  . DILATION AND CURETTAGE OF UTERUS    . VAGINAL DELIVERY     Family History  Problem Relation Age of Onset  . Hyperlipidemia Mother   . Hypertension Mother   . COPD Mother   . Aneurysm Mother   . Hypertension Father   . COPD Father   . Heart disease Brother     CAD s/p stents   History  Sexual Activity  . Sexual activity: Not Currently    Outpatient Encounter Prescriptions as of 04/20/2016  Medication Sig  . Calcium 600-200 MG-UNIT tablet Take 1 tablet by mouth daily.  Marland Kitchen levETIRAcetam (KEPPRA) 500 MG tablet Take 1 tablet (500 mg total) by mouth 2 (two) times daily.  . TURMERIC PO Take 300 mg by mouth daily.   . [DISCONTINUED] ALPRAZolam (XANAX) 0.5 MG tablet Take 1 tablet (0.5 mg total) by mouth daily as needed for anxiety.   No facility-administered encounter medications on file as of 04/20/2016.     Activities of Daily Living In your present state of health, do you have any difficulty performing the following activities: 04/20/2016   Hearing? N  Vision? N  Difficulty concentrating or making decisions? N  Walking or climbing stairs? N  Dressing or bathing? N  Doing errands, shopping? Y  Preparing Food and eating ? N  Using the Toilet? N  In the past six months, have you accidently leaked urine? N  Do you have problems with loss of bowel control? N  Managing your Medications? N  Managing your Finances? N  Housekeeping or managing your Housekeeping? N  Some recent data might be hidden    Patient Care Team: Allegra Grana, FNP as PCP - General (Family Medicine)    Assessment:    This is a routine wellness examination for Samantha Ellison. The goal of the wellness visit is to assist the patient how to close the gaps in care and create a preventative care plan for the patient.   Taking calcium VIT D as appropriate/Osteoporosis risk reviewed.  Medications reviewed; taking without issues or barriers.  Safety issues reviewed; smoke detectors in the home. No firearms in the home. Wears seatbelts when driving or riding with others. No violence in the home.  No identified risk were noted; The patient was oriented x 3; appropriate in dress and manner and no objective  failures at ADL's or IADL's.   BMI; discussed the importance of a healthy diet, water intake and exercise. Educational material provided.  HTN; followed by PCP.  Health maintenance gaps; closed.  Patient Concerns: None at this time. Follow up with PCP as needed.  Exercise Activities and Dietary recommendations Current Exercise Habits: Home exercise routine, Type of exercise: stretching;calisthenics (Home dvd work out), Time (Minutes): 45, Frequency (Times/Week): 3, Weekly Exercise (Minutes/Week): 135, Intensity: Moderate  Goals    . Healthy Foods          Lean meats, vegetables and fruit.  Low carb intake.  Educational material provided.      Fall Risk Fall Risk  04/20/2016 12/21/2015 11/16/2015 11/17/2014 03/29/2014  Falls in the past year? No  No No No No   Depression Screen PHQ 2/9 Scores 04/20/2016 12/21/2015 11/16/2015 11/17/2014  PHQ - 2 Score 0 0 0 0     Cognitive Function MMSE - Mini Mental State Exam 11/17/2014  Orientation to time 5  Orientation to Place 5  Registration 3  Attention/ Calculation 5  Recall 3  Language- name 2 objects 2  Language- repeat 1  Language- follow 3 step command 3  Language- read & follow direction 1  Write a sentence 1  Copy design 1  Total score 30     6CIT Screen 04/20/2016  What Year? 0 points  What month? 0 points  What time? 0 points  Count back from 20 0 points  Months in reverse 0 points  Repeat phrase 0 points  Total Score 0    Immunization History  Administered Date(s) Administered  . Influenza, High Dose Seasonal PF 11/17/2014  . Influenza,inj,Quad PF,36+ Mos 11/16/2015  . Influenza-Unspecified 12/27/2012, 01/02/2014  . Pneumococcal Conjugate-13 08/27/2013  . Pneumococcal Polysaccharide-23 01/30/2012  . Tdap 12/27/2008, 02/04/2015  . Zoster 12/27/2012   Screening Tests Health Maintenance  Topic Date Due  . PNA vac Low Risk Adult (2 of 2 - PPSV23) 01/29/2017  . MAMMOGRAM  12/14/2017  . COLONOSCOPY  08/28/2023  . TETANUS/TDAP  02/03/2025  . INFLUENZA VACCINE  Completed  . DEXA SCAN  Completed  . ZOSTAVAX  Completed  . Hepatitis C Screening  Completed      Plan:    End of life planning; Advance aging; Advanced directives discussed. No HCPOA/Living Will.  Additional information provided to help her start the conversation with her family.  Copy of HCPOA/Living Will short forms requested upon completion.  Time spent on this topic is 18 minutes.  Medicare Attestation I have personally reviewed: The patient's medical and social history Their use of alcohol, tobacco or illicit drugs Their current medications and supplements The patient's functional ability including ADLs,fall risks, home safety risks, cognitive, and hearing and visual impairment Diet and physical  activities Evidence for depression   The patient's weight, height, BMI, and visual acuity have been recorded in the chart.  I have made referrals and provided education to the patient based on review of the above and I have provided the patient with a written personalized care plan for preventive services.    During the course of the visit the patient was educated and counseled about the following appropriate screening and preventive services:   Vaccines to include Pneumoccal, Influenza, Hepatitis B, Td, Zostavax, HCV  Electrocardiogram  Cardiovascular Disease  Colorectal cancer screening  Bone density screening  Diabetes screening  Glaucoma screening  Mammography/PAP  Nutrition counseling   Patient Instructions (the written plan) was given to the patient.  Ashok PallOBrien-Blaney, Charlisa Cham L, LPN  1/6/10962/04/2016

## 2016-04-20 NOTE — Patient Instructions (Addendum)
  Ms. Samantha Ellison , Thank you for taking time to come for your Medicare Wellness Visit. I appreciate your ongoing commitment to your health goals. Please review the following plan we discussed and let me know if I can assist you in the future.   Follow up with Rennie PlowmanMargaret Arnett, FNP as needed.  These are the goals we discussed: Goals    . Healthy Foods          Lean meats, vegetables and fruit.  Low carb intake.  Educational material provided.       This is a list of the screening recommended for you and due dates:  Health Maintenance  Topic Date Due  . Pneumonia vaccines (2 of 2 - PPSV23) 01/29/2017  . Mammogram  12/14/2017  . Colon Cancer Screening  08/28/2023  . Tetanus Vaccine  02/03/2025  . Flu Shot  Completed  . DEXA scan (bone density measurement)  Completed  . Shingles Vaccine  Completed  .  Hepatitis C: One time screening is recommended by Center for Disease Control  (CDC) for  adults born from [birth year] through 1967.   Completed

## 2016-04-20 NOTE — Progress Notes (Signed)
Care was provided under my supervision. I agree with the management as indicated in the note.  Martice Doty DO  

## 2017-02-06 ENCOUNTER — Encounter: Admission: RE | Payer: Self-pay | Source: Ambulatory Visit

## 2017-02-06 ENCOUNTER — Ambulatory Visit
Admission: RE | Admit: 2017-02-06 | Payer: Medicare Other | Source: Ambulatory Visit | Admitting: Unknown Physician Specialty

## 2017-02-06 SURGERY — COLONOSCOPY WITH PROPOFOL
Anesthesia: General

## 2017-02-11 ENCOUNTER — Ambulatory Visit
Admission: RE | Admit: 2017-02-11 | Payer: Medicare Other | Source: Ambulatory Visit | Admitting: Unknown Physician Specialty

## 2017-02-11 ENCOUNTER — Encounter: Admission: RE | Payer: Self-pay | Source: Ambulatory Visit

## 2017-02-11 SURGERY — COLONOSCOPY
Anesthesia: General

## 2017-04-05 ENCOUNTER — Telehealth: Payer: Self-pay | Admitting: Family Medicine

## 2017-04-05 NOTE — Telephone Encounter (Signed)
Pt would like to have her complete blood work up before 05/21/2017. Order needed please and thank you!  Call pt @ [phone].

## 2017-04-05 NOTE — Telephone Encounter (Signed)
Please advise 

## 2017-04-06 NOTE — Telephone Encounter (Signed)
This appears to be an Arnett patient. Can you check with the front office staff to see why I am seeing her for a physical as Margarett has openings. She should be able to see her PCP for her physical. Thanks.

## 2017-04-08 NOTE — Telephone Encounter (Signed)
Do you know why she scheduled with DR.Sonnenberg instead of Claris CheMargaret?

## 2017-04-12 NOTE — Telephone Encounter (Signed)
Everything is straight now pt has her appts scheduled with Arnett. I think she was scheduling her appts with Dr [facility] I rescheduled pt with Arnett.

## 2017-04-23 ENCOUNTER — Ambulatory Visit (INDEPENDENT_AMBULATORY_CARE_PROVIDER_SITE_OTHER): Payer: Medicare Other

## 2017-04-23 VITALS — BP 128/80 | HR 75 | Temp 98.4°F | Resp 14 | Ht 66.0 in | Wt 182.0 lb

## 2017-04-23 DIAGNOSIS — R030 Elevated blood-pressure reading, without diagnosis of hypertension: Secondary | ICD-10-CM

## 2017-04-23 DIAGNOSIS — I10 Essential (primary) hypertension: Secondary | ICD-10-CM | POA: Diagnosis not present

## 2017-04-23 DIAGNOSIS — R739 Hyperglycemia, unspecified: Secondary | ICD-10-CM

## 2017-04-23 DIAGNOSIS — E538 Deficiency of other specified B group vitamins: Secondary | ICD-10-CM

## 2017-04-23 DIAGNOSIS — Z0000 Encounter for general adult medical examination without abnormal findings: Secondary | ICD-10-CM

## 2017-04-23 DIAGNOSIS — E559 Vitamin D deficiency, unspecified: Secondary | ICD-10-CM

## 2017-04-23 DIAGNOSIS — Z1331 Encounter for screening for depression: Secondary | ICD-10-CM | POA: Diagnosis not present

## 2017-04-23 DIAGNOSIS — R5383 Other fatigue: Secondary | ICD-10-CM

## 2017-04-23 NOTE — Patient Instructions (Addendum)
  Ms. Katrinka BlazingSmith , Thank you for taking time to come for your Medicare Wellness Visit. I appreciate your ongoing commitment to your health goals. Please review the following plan we discussed and let me know if I can assist you in the future.   Return for fasting labs and scheduled physical.  These are the goals we discussed: Goals    . Healthy Foods     Lean meats, vegetables and fruit.  Low carb intake.  Educational material provided.       This is a list of the screening recommended for you and due dates:  Health Maintenance  Topic Date Due  . Flu Shot  10/17/2016  . Pneumonia vaccines (2 of 2 - PPSV23) 01/29/2017  . Mammogram  12/14/2017  . Colon Cancer Screening  08/28/2023  . Tetanus Vaccine  02/03/2025  . DEXA scan (bone density measurement)  Completed  .  Hepatitis C: One time screening is recommended by Center for Disease Control  (CDC) for  adults born from [birth year] through 1967.   Completed

## 2017-04-23 NOTE — Progress Notes (Cosign Needed)
Dictation #1 [id number]  [id number] Subjective:   Samantha Ellison is a 67 y.o. female who presents for Medicare Annual (Subsequent) preventive examination.  Review of Systems:  No ROS.  Medicare Wellness Visit. Additional risk factors are reflected in the social history.  Cardiac Risk Factors include: advanced age (>7655men, 61>65 women);hypertension     Objective:     Vitals: BP 128/80 (BP Location: Left Arm, Patient Position: Sitting, Cuff Size: Normal)   Pulse 75   Temp 98.4 F (36.9 C) (Oral)   Resp 14   Ht 5\' 6"  (1.676 m)   Wt 182 lb (82.6 kg)   LMP  (LMP Unknown)   SpO2 96%   BMI 29.38 kg/m   Body mass index is 29.38 kg/m.  Advanced Directives 04/23/2017 04/20/2016 02/27/2016 12/18/2015 11/17/2014  Does Patient Have a Medical Advance Directive? No No Yes No Yes  Type of Advance Directive - - Living will;Healthcare Power of Attorney - Healthcare Power of Attorney  Does patient want to make changes to medical advance directive? Yes (MAU/Ambulatory/Procedural Areas - Information given) - - - Yes - information given  Copy of Healthcare Power of Attorney in Chart? - - - - Yes  Would patient like information on creating a medical advance directive? - Yes (MAU/Ambulatory/Procedural Areas - Information given) - - -    Tobacco Social History   Tobacco Use  Smoking Status Never Smoker  Smokeless Tobacco Never Used     Counseling given: Not Answered   Clinical Intake:  Pre-visit preparation completed: Yes  Pain : No/denies pain     Nutritional Status: BMI 25 -29 Overweight Diabetes: No  How often do you need to have someone help you when you read instructions, pamphlets, or other written materials from your doctor or pharmacy?: 1 - Never  Interpreter Needed?: No     Past Medical History:  Diagnosis Date  . Arthritis   . Arthritis of right knee   . Bilateral ankle fractures   . Fibrocystic breast disease   . History of chicken pox   . Seizures (HCC)     Past Surgical History:  Procedure Laterality Date  . DILATION AND CURETTAGE OF UTERUS    . VAGINAL DELIVERY     Family History  Problem Relation Age of Onset  . Hyperlipidemia Mother   . Hypertension Mother   . COPD Mother   . Aneurysm Mother   . Hypertension Father   . COPD Father   . Depression Daughter   . Heart disease Brother        CAD s/p stents   Social History   Socioeconomic History  . Marital status: Married    Spouse name: None  . Number of children: None  . Years of education: None  . Highest education level: None  Social Needs  . Financial resource strain: None  . Food insecurity - worry: None  . Food insecurity - inability: None  . Transportation needs - medical: None  . Transportation needs - non-medical: None  Occupational History  . None  Tobacco Use  . Smoking status: Never Smoker  . Smokeless tobacco: Never Used  Substance and Sexual Activity  . Alcohol use: No  . Drug use: No  . Sexual activity: Not Currently  Other Topics Concern  . None  Social History Narrative   [place]. Has 2 cats.      Work - Retired Adult nursemedical transcription [employer]      Diet - regular  Exercise - [employer] for water aerobics, walking, limited by hip pain. Yoga twice weekly. Gardening      Enjoy 'pick your own peaches, blueberries, apples.' [place].         Outpatient Encounter Medications as of 04/23/2017  Medication Sig  . Calcium 600-200 MG-UNIT tablet Take 1 tablet by mouth daily.  . Cholecalciferol (VITAMIN D-1000 MAX ST) 1000 units tablet Take by mouth.  . TURMERIC PO Take 300 mg by mouth daily.   Marland Kitchen levETIRAcetam (KEPPRA) 500 MG tablet Take 1 tablet (500 mg total) by mouth 2 (two) times daily. (Patient not taking: Reported on 04/23/2017)  . vitamin B-12 (CYANOCOBALAMIN) 1000 MCG tablet Take by mouth.   No facility-administered encounter medications on file as of 04/23/2017.     Activities of Daily Living In your present  state of health, do you have any difficulty performing the following activities: 04/23/2017  Hearing? Y  Comment Hearing aid, bilateral  Vision? N  Difficulty concentrating or making decisions? Y  Comment Little difficulty concentrating  Walking or climbing stairs? N  Dressing or bathing? N  Doing errands, shopping? N  Preparing Food and eating ? N  Using the Toilet? N  In the past six months, have you accidently leaked urine? N  Do you have problems with loss of bowel control? N  Managing your Medications? N  Managing your Finances? N  Housekeeping or managing your Housekeeping? N  Some recent data might be hidden    Patient Care Team: Allegra Grana, FNP as PCP - General (Family Medicine)    Assessment:   This is a routine wellness examination for Samantha Ellison. The goal of the wellness visit is to assist the patient how to close the gaps in care and create a preventative care plan for the patient.   The roster of all physicians providing medical care to patient is listed in the Snapshot section of the chart.  Taking calcium VIT D as appropriate/Osteoporosis risk reviewed.    Safety issues reviewed; Smoke and carbon monoxide detectors in the home. No firearms in the home.  Wears seatbelts when driving or riding with others. Patient does wear sunscreen or protective clothing when in direct sunlight. No violence in the home.  Depression- PHQ 2 &9 complete.  No signs/symptoms or verbal communication regarding little pleasure in doing things, feeling down, depressed or hopeless. No changes in sleeping, energy, eating, concentrating.  No thoughts of self harm or harm towards others.  Time spent on this topic is 8 minutes.   Patient is alert, normal appearance, oriented to person/place/and time. Correctly identified the president of the [place], recall of 2/3 words, and little difficulty performing simple calculations. Complete MMSE refused.  Displays appropriate judgement and can read  correct time from watch face.   No new identified risk were noted.  No failures at ADL's or IADL's.    BMI- discussed the importance of a healthy diet, water intake and the benefits of aerobic exercise. Educational material provided.   24 hour diet recall: Regular diet  Daily fluid intake: 1 cups of caffeine, 7-9 cups of water  Dental- every 6 months.  Eye- Visual acuity not assessed per patient preference since they have regular follow up with the ophthalmologist.  Wears corrective lenses.  Sleep patterns- Sleeps 8 hours at night.  Wakes feeling rested.   Fasting labs placed. Upcoming appointment with Neurology 05/21/17 and CPE with PCP 05/31/17.   Pneumovax 23 vaccine discussed.  Deferred per patient preference. Educational  material provided.   Patient Concerns: None at this time. Follow up with PCP as needed.  Exercise Activities and Dietary recommendations Current Exercise Habits: Structured exercise class, Type of exercise: walking(water aerobics), Frequency (Times/Week): 3, Intensity: Moderate  Goals    . Healthy Foods     Lean meats, vegetables and fruit.  Low carb intake.  Educational material provided.       Fall Risk Fall Risk  04/23/2017 04/20/2016 12/21/2015 11/16/2015 11/17/2014  Falls in the past year? No No No No No   Depression Screen PHQ 2/9 Scores 04/23/2017 04/20/2016 12/21/2015 11/16/2015  PHQ - 2 Score 0 0 0 0     Cognitive Function MMSE - Mini Mental State Exam 04/23/2017 11/17/2014  Not completed: Refused -  Orientation to time - 5  Orientation to Place - 5  Registration - 3  Attention/ Calculation - 5  Recall - 3  Language- name 2 objects - 2  Language- repeat - 1  Language- follow 3 step command - 3  Language- read & follow direction - 1  Write a sentence - 1  Copy design - 1  Total score - 30     6CIT Screen 04/20/2016  What Year? 0 points  What month? 0 points  What time? 0 points  Count back from 20 0 points  Months in reverse 0 points    Repeat phrase 0 points  Total Score 0    Immunization History  Administered Date(s) Administered  . Influenza, High Dose Seasonal PF 11/17/2014  . Influenza,inj,Quad PF,6+ Mos 11/16/2015  . Influenza-Unspecified 12/27/2012, 01/02/2014  . Pneumococcal Conjugate-13 08/27/2013  . Pneumococcal Polysaccharide-23 01/30/2012  . Tdap 12/27/2008, 02/04/2015  . Zoster 12/27/2012    Screening Tests Health Maintenance  Topic Date Due  . INFLUENZA VACCINE  10/17/2016  . PNA vac Low Risk Adult (2 of 2 - PPSV23) 01/29/2017  . MAMMOGRAM  12/14/2017  . COLONOSCOPY  08/28/2023  . TETANUS/TDAP  02/03/2025  . DEXA SCAN  Completed  . Hepatitis C Screening  Completed      Plan:   End of life planning; Advanced aging; Advanced directives discussed.  No HCPOA/Living Will.  Additional information provided to help them start the conversation with family.  Copy of HCPOA/Living Will requested upon completion. Time spent on this topic is 25 minutes.  I have personally reviewed and noted the following in the patient's chart:   . Medical and social history . Use of alcohol, tobacco or illicit drugs  . Current medications and supplements . Functional ability and status . Nutritional status . Physical activity . Advanced directives . List of other physicians . Hospitalizations, surgeries, and ER visits in previous 12 months . Vitals . Screenings to include cognitive, depression, and falls . Referrals and appointments  In addition, I have reviewed and discussed with patient certain preventive protocols, quality metrics, and best practice recommendations. A written personalized care plan for preventive services as well as general preventive health recommendations were provided to patient.     Ashok Pall, LPN  03/24/1094

## 2017-05-14 ENCOUNTER — Encounter: Payer: Medicare Other | Admitting: Family Medicine

## 2017-05-17 ENCOUNTER — Other Ambulatory Visit (INDEPENDENT_AMBULATORY_CARE_PROVIDER_SITE_OTHER): Payer: Medicare Other

## 2017-05-17 DIAGNOSIS — E538 Deficiency of other specified B group vitamins: Secondary | ICD-10-CM

## 2017-05-17 DIAGNOSIS — R739 Hyperglycemia, unspecified: Secondary | ICD-10-CM | POA: Diagnosis not present

## 2017-05-17 DIAGNOSIS — Z0000 Encounter for general adult medical examination without abnormal findings: Secondary | ICD-10-CM | POA: Diagnosis not present

## 2017-05-17 DIAGNOSIS — R5383 Other fatigue: Secondary | ICD-10-CM

## 2017-05-17 DIAGNOSIS — E559 Vitamin D deficiency, unspecified: Secondary | ICD-10-CM | POA: Diagnosis not present

## 2017-05-17 DIAGNOSIS — R030 Elevated blood-pressure reading, without diagnosis of hypertension: Secondary | ICD-10-CM

## 2017-05-17 LAB — LIPID PANEL
Cholesterol: 224 mg/dL — ABNORMAL HIGH (ref 0–200)
HDL: 49.5 mg/dL (ref >39.00)
LDL Cholesterol: 142 mg/dL — ABNORMAL HIGH (ref 0–99)
NonHDL: 174.33
TRIGLYCERIDES: 164 mg/dL — AB (ref 0.0–149.0)
Total CHOL/HDL Ratio: 5
VLDL: 32.8 mg/dL (ref 0.0–40.0)

## 2017-05-17 LAB — COMPREHENSIVE METABOLIC PANEL
ALK PHOS: 103 U/L (ref 39–117)
ALT: 15 U/L (ref 0–35)
AST: 17 U/L (ref 0–37)
Albumin: 4.4 g/dL (ref 3.5–5.2)
BILIRUBIN TOTAL: 1 mg/dL (ref 0.2–1.2)
BUN: 13 mg/dL (ref 6–23)
CALCIUM: 10.3 mg/dL (ref 8.4–10.5)
CO2: 27 mEq/L (ref 19–32)
Chloride: 106 mEq/L (ref 96–112)
Creatinine, Ser: 0.83 mg/dL (ref 0.40–1.20)
GFR: 72.46 mL/min (ref >60.00)
GLUCOSE: 87 mg/dL (ref 70–99)
POTASSIUM: 4.5 meq/L (ref 3.5–5.1)
Sodium: 144 mEq/L (ref 135–145)
TOTAL PROTEIN: 7.4 g/dL (ref 6.0–8.3)

## 2017-05-17 LAB — CBC WITH DIFFERENTIAL/PLATELET
BASOS ABS: 0 10*3/uL (ref 0.0–0.1)
Basophils Relative: 0.8 % (ref 0.0–3.0)
EOS PCT: 3.4 % (ref 0.0–5.0)
Eosinophils Absolute: 0.2 10*3/uL (ref 0.0–0.7)
HEMATOCRIT: 45.8 % (ref 36.0–46.0)
Hemoglobin: 15.4 g/dL — ABNORMAL HIGH (ref 12.0–15.0)
LYMPHS ABS: 1.4 10*3/uL (ref 0.7–4.0)
LYMPHS PCT: 31 % (ref 12.0–46.0)
MCHC: 33.7 g/dL (ref 30.0–36.0)
MCV: 89.8 fl (ref 78.0–100.0)
MONOS PCT: 7.4 % (ref 3.0–12.0)
Monocytes Absolute: 0.3 10*3/uL (ref 0.1–1.0)
NEUTROS ABS: 2.7 10*3/uL (ref 1.4–7.7)
NEUTROS PCT: 57.4 % (ref 43.0–77.0)
PLATELETS: 228 10*3/uL (ref 150.0–400.0)
RBC: 5.1 Mil/uL (ref 3.87–5.11)
RDW: 12.8 % (ref 11.5–15.5)
WBC: 4.6 10*3/uL (ref 4.0–10.5)

## 2017-05-17 LAB — VITAMIN D 25 HYDROXY (VIT D DEFICIENCY, FRACTURES): VITD: 51.87 ng/mL (ref 30.00–100.00)

## 2017-05-17 LAB — VITAMIN B12: VITAMIN B 12: 844 pg/mL (ref 211–911)

## 2017-05-17 LAB — LDL CHOLESTEROL, DIRECT: LDL DIRECT: 152 mg/dL

## 2017-05-17 LAB — HEMOGLOBIN A1C: Hgb A1c MFr Bld: 5.7 % (ref 4.6–6.5)

## 2017-05-17 LAB — TSH: TSH: 4.32 u[IU]/mL (ref 0.35–4.50)

## 2017-05-28 ENCOUNTER — Encounter: Payer: Medicare Other | Admitting: Family Medicine

## 2017-05-31 ENCOUNTER — Encounter: Payer: Self-pay | Admitting: Family

## 2017-05-31 ENCOUNTER — Ambulatory Visit (INDEPENDENT_AMBULATORY_CARE_PROVIDER_SITE_OTHER): Payer: Medicare Other | Admitting: Family

## 2017-05-31 VITALS — BP 122/80 | HR 70 | Temp 98.0°F | Ht 65.0 in | Wt 182.0 lb

## 2017-05-31 DIAGNOSIS — Z23 Encounter for immunization: Secondary | ICD-10-CM

## 2017-05-31 DIAGNOSIS — Z0000 Encounter for general adult medical examination without abnormal findings: Secondary | ICD-10-CM

## 2017-05-31 NOTE — Progress Notes (Signed)
Subjective:    Patient ID: Samantha Ellison, female    DOB: October 21, 1950, 67 y.o.   MRN: [id number]  Samantha Ellison is a 67 y.o. female who presents today for physical exam.    HPI: HLD- has been on pravachol for past week. Tolerating well.    H/o of seizure- none since. Driving now. Neurology 11/2016 and 05/2017   Staying on keppra 500mg   Colorectal Cancer Screening: Unsure if Did she have 2015- no pathology or results seen Breast Cancer Screening: Mammogram due Cervical Cancer Screening: 2017, normal at 67 years old. UTD. No pelvic pain, abdominal distention.  Bone Health screening/DEXA for 65+: last 2017, osteopenia Lung Cancer Screening: Doesn't have 30 year pack year history and age > 55 years. Immunizations       Tetanus - utd        Pneumococcal - Candidate for pneuomo 23  Labs: Screening labs done prior Exercise: Gets regular exercise.  Alcohol use: none Smoking/tobacco use: Nonsmoker.  Regular dental exams: UTD Wears seat belt: Yes. Skin: goes once per year; no h/o skin cancer.      HISTORY:  Past Medical History:  Diagnosis Date  . Arthritis   . Arthritis of right knee   . Bilateral ankle fractures   . Fibrocystic breast disease   . History of chicken pox   . Seizures (HCC)     Past Surgical History:  Procedure Laterality Date  . DILATION AND CURETTAGE OF UTERUS    . VAGINAL DELIVERY     Family History  Problem Relation Age of Onset  . Hyperlipidemia Mother   . Hypertension Mother   . COPD Mother   . Aneurysm Mother   . Hypertension Father   . COPD Father   . Depression Daughter   . Heart disease Brother        CAD s/p stents      ALLERGIES: Compazine [prochlorperazine edisylate]  Current Outpatient Medications on File Prior to Visit  Medication Sig Dispense Refill  . Calcium 600-200 MG-UNIT tablet Take 1 tablet by mouth daily.    . Cholecalciferol (VITAMIN D-1000 MAX ST) 1000 units tablet Take by mouth.    . levETIRAcetam (KEPPRA) 500  MG tablet Take 1 tablet (500 mg total) by mouth 2 (two) times daily. 60 tablet 1  . Multiple Vitamins-Minerals (ICAPS MV PO) Take 1 tablet by mouth.    . pravastatin (PRAVACHOL) 10 MG tablet Take 10 mg by mouth daily.    . TURMERIC PO Take 300 mg by mouth daily.     . vitamin B-12 (CYANOCOBALAMIN) 1000 MCG tablet Take by mouth.     No current facility-administered medications on file prior to visit.     Social History   Tobacco Use  . Smoking status: Never Smoker  . Smokeless tobacco: Never Used  Substance Use Topics  . Alcohol use: No  . Drug use: No    Review of Systems  Constitutional: Negative for chills, fever and unexpected weight change.  HENT: Negative for congestion.   Respiratory: Negative for cough.   Cardiovascular: Negative for chest pain, palpitations and leg swelling.  Gastrointestinal: Negative for abdominal distention, nausea and vomiting.  Genitourinary: Negative for pelvic pain.  Musculoskeletal: Negative for arthralgias and myalgias.  Skin: Negative for rash.  Neurological: Negative for headaches.  Hematological: Negative for adenopathy.  Psychiatric/Behavioral: Negative for confusion.      Objective:    BP 122/80 (BP Location: Left Arm, Patient Position: Sitting, Cuff Size: Large)  Pulse 70   Temp 98 F (36.7 C) (Oral)   Ht 5\' 5"  (1.651 m)   Wt 182 lb (82.6 kg)   LMP  (LMP Unknown)   SpO2 98%   BMI 30.29 kg/m   BP Readings from Last 3 Encounters:  05/31/17 122/80  04/23/17 128/80  04/20/16 136/76   Wt Readings from Last 3 Encounters:  05/31/17 182 lb (82.6 kg)  04/23/17 182 lb (82.6 kg)  04/20/16 192 lb 6.4 oz (87.3 kg)    Physical Exam  Constitutional: She appears well-developed and well-nourished.  Eyes: Conjunctivae are normal.  Neck: No thyroid mass and no thyromegaly present.  Cardiovascular: Normal rate, regular rhythm, normal heart sounds and normal pulses.  Pulmonary/Chest: Effort normal and breath sounds normal. She has no  wheezes. She has no rhonchi. She has no rales. Right breast exhibits no inverted nipple, no mass, no nipple discharge, no skin change and no tenderness. Left breast exhibits no inverted nipple, no mass, no nipple discharge, no skin change and no tenderness. Breasts are symmetrical.  CBE performed.   Lymphadenopathy:       Head (right side): No submental, no submandibular, no tonsillar, no preauricular, no posterior auricular and no occipital adenopathy present.       Head (left side): No submental, no submandibular, no tonsillar, no preauricular, no posterior auricular and no occipital adenopathy present.    She has no cervical adenopathy.       Right cervical: No superficial cervical, no deep cervical and no posterior cervical adenopathy present.      Left cervical: No superficial cervical, no deep cervical and no posterior cervical adenopathy present.    She has no axillary adenopathy.  Neurological: She is alert.  Skin: Skin is warm and dry.  Psychiatric: She has a normal mood and affect. Her speech is normal and behavior is normal. Thought content normal.  Vitals reviewed.      Assessment & Plan:   Problem List Items Addressed This Visit      Other   Routine general medical examination at a health care facility - Primary    Clinical breast exam performed.  Pelvic exam was not performed the absence of complaints.  She is also up-to-date on her Pap smear.  We also discussed that now that she is over the age of 67, at her next physical, she may or may not choose to pursue Paps anymore.  Unclear at this time regarding her colonoscopy as it cannot tell that was done in 2015- I have messaged patient as I failed to bring up in appointment.  I have ordered a DEXA scan today for her.  She was also given Pneumovax 23.  Encouraged exercise.      Relevant Orders   MM Digital Screening   DG Bone Density       I am having Sharion SettlerShelley W. Anschutz maintain her TURMERIC PO, levETIRAcetam, Calcium,  Cholecalciferol, vitamin B-12, Multiple Vitamins-Minerals (ICAPS MV PO), and pravastatin.   No orders of the defined types were placed in this encounter.   Return precautions given.   Risks, benefits, and alternatives of the medications and treatment plan prescribed today were discussed, and patient expressed understanding.   Education regarding symptom management and diagnosis given to patient on AVS.   Continue to follow with Allegra GranaArnett, Margaret G, FNP for routine health maintenance.   Sharion SettlerShelley W Traister and I agreed with plan.   Rennie PlowmanMargaret Arnett, FNP

## 2017-05-31 NOTE — Assessment & Plan Note (Signed)
Clinical breast exam performed.  Pelvic exam was not performed the absence of complaints.  She is also up-to-date on her Pap smear.  We also discussed that now that she is over the age of 67, at her next physical, she may or may not choose to pursue Paps anymore.  Unclear at this time regarding her colonoscopy as it cannot tell that was done in 2015- I have messaged patient as I failed to bring up in appointment.  I have ordered a DEXA scan today for her.  She was also given Pneumovax 23.  Encouraged exercise.

## 2017-05-31 NOTE — Patient Instructions (Signed)
We placed a referral for mammogram this year. I asked that you call one the below locations and schedule this when it is convenient for you.   As discussed, I would like you to ask for 3D mammogram over the traditional 2D mammogram as new evidence suggest 3D is superior.   Please note that NOT all insurance companies cover 3D and you may have to pay a higher copay. You may call your insurance company to further clarify your benefits.   Options for [place], [place]  * [facility] [place], [place] * Note if you ask for 3D mammogram at this location, you must request [place], [place] location*   DEXA SCAN- please let us know if we do not call you.   Health Maintenance, Female Adopting a healthy lifestyle and getting preventive care can go a long way to promote health and wellness. Talk with your health care provider about what schedule of regular examinations is right for you. This is a good chance for you to check in with your provider about disease prevention and staying healthy. In between checkups, there are plenty of things you can do on your own. Experts have done a lot of research about which lifestyle changes and preventive measures are most likely to keep you healthy. Ask your health care provider for more information. Weight and diet Eat a healthy diet  Be sure to include plenty of vegetables, fruits, low-fat dairy products, and lean protein.  Do not eat a lot of foods high in solid fats, added sugars, or salt.  Get regular exercise. This is one of the most important things you can do for your health. ? Most adults should exercise for at least 150 minutes each week. The exercise should increase your heart rate and make you sweat (moderate-intensity exercise). ? Most adults should also do strengthening exercises at  least twice a week. This is in addition to the moderate-intensity exercise.  Maintain a healthy weight  Body mass index (BMI) is a measurement that can be used to identify possible weight problems. It estimates body fat based on height and weight. Your health care provider can help determine your BMI and help you achieve or maintain a healthy weight.  For females 30 years of age and older: ? A BMI below 18.5 is considered underweight. ? A BMI of 18.5 to 24.9 is normal. ? A BMI of 25 to 29.9 is considered overweight. ? A BMI of 30 and above is considered obese.  Watch levels of cholesterol and blood lipids  You should start having your blood tested for lipids and cholesterol at 69 years of age, then have this test every 5 years.  You may need to have your cholesterol levels checked more often if: ? Your lipid or cholesterol levels are high. ? You are older than 69 years of age. ? You are at high risk for heart disease.  Cancer screening Lung Cancer  Lung cancer screening is recommended for adults 83-49 years old who are at high risk for lung cancer because of a history of smoking.  A yearly low-dose CT scan of the lungs is recommended for people who: ? Currently smoke. ? Have quit within the past 15 years. ? Have at least a 30-pack-year history of smoking. A pack year is smoking an average of one pack of  cigarettes a day for 1 year.  Yearly screening should continue until it has been 15 years since you quit.  Yearly screening should stop if you develop a health problem that would prevent you from having lung cancer treatment.  Breast Cancer  Practice breast self-awareness. This means understanding how your breasts normally appear and feel.  It also means doing regular breast self-exams. Let your health care provider know about any changes, no matter how small.  If you are in your 20s or 30s, you should have a clinical breast exam (CBE) by a health care provider every 1-3 years  as part of a regular health exam.  If you are 31 or older, have a CBE every year. Also consider having a breast X-ray (mammogram) every year.  If you have a family history of breast cancer, talk to your health care provider about genetic screening.  If you are at high risk for breast cancer, talk to your health care provider about having an MRI and a mammogram every year.  Breast cancer gene (BRCA) assessment is recommended for women who have family members with BRCA-related cancers. BRCA-related cancers include: ? Breast. ? Ovarian. ? Tubal. ? Peritoneal cancers.  Results of the assessment will determine the need for genetic counseling and BRCA1 and BRCA2 testing.  Cervical Cancer Your health care provider may recommend that you be screened regularly for cancer of the pelvic organs (ovaries, uterus, and vagina). This screening involves a pelvic examination, including checking for microscopic changes to the surface of your cervix (Pap test). You may be encouraged to have this screening done every 3 years, beginning at age 2.  For women ages 38-65, health care providers may recommend pelvic exams and Pap testing every 3 years, or they may recommend the Pap and pelvic exam, combined with testing for human papilloma virus (HPV), every 5 years. Some types of HPV increase your risk of cervical cancer. Testing for HPV may also be done on women of any age with unclear Pap test results.  Other health care providers may not recommend any screening for nonpregnant women who are considered low risk for pelvic cancer and who do not have symptoms. Ask your health care provider if a screening pelvic exam is right for you.  If you have had past treatment for cervical cancer or a condition that could lead to cancer, you need Pap tests and screening for cancer for at least 20 years after your treatment. If Pap tests have been discontinued, your risk factors (such as having a new sexual partner) need to be  reassessed to determine if screening should resume. Some women have medical problems that increase the chance of getting cervical cancer. In these cases, your health care provider may recommend more frequent screening and Pap tests.  Colorectal Cancer  This type of cancer can be detected and often prevented.  Routine colorectal cancer screening usually begins at 69 years of age and continues through 69 years of age.  Your health care provider may recommend screening at an earlier age if you have risk factors for colon cancer.  Your health care provider may also recommend using home test kits to check for hidden blood in the stool.  A small camera at the end of a tube can be used to examine your colon directly (sigmoidoscopy or colonoscopy). This is done to check for the earliest forms of colorectal cancer.  Routine screening usually begins at age 59.  Direct examination of the colon should be repeated every  5-10 years through 69 years of age. However, you may need to be screened more often if early forms of precancerous polyps or small growths are found.  Skin Cancer  Check your skin from head to toe regularly.  Tell your health care provider about any new moles or changes in moles, especially if there is a change in a mole's shape or color.  Also tell your health care provider if you have a mole that is larger than the size of a pencil eraser.  Always use sunscreen. Apply sunscreen liberally and repeatedly throughout the day.  Protect yourself by wearing long sleeves, pants, a wide-brimmed hat, and sunglasses whenever you are outside.  Heart disease, diabetes, and high blood pressure  High blood pressure causes heart disease and increases the risk of stroke. High blood pressure is more likely to develop in: ? People who have blood pressure in the high end of the normal range (130-139/85-89 mm Hg). ? People who are overweight or obese. ? People who are African American.  If you  are 47-36 years of age, have your blood pressure checked every 3-5 years. If you are 66 years of age or older, have your blood pressure checked every year. You should have your blood pressure measured twice-once when you are at a hospital or clinic, and once when you are not at a hospital or clinic. Record the average of the two measurements. To check your blood pressure when you are not at a hospital or clinic, you can use: ? An automated blood pressure machine at a pharmacy. ? A home blood pressure monitor.  If you are between 82 years and 67 years old, ask your health care provider if you should take aspirin to prevent strokes.  Have regular diabetes screenings. This involves taking a blood sample to check your fasting blood sugar level. ? If you are at a normal weight and have a low risk for diabetes, have this test once every three years after 69 years of age. ? If you are overweight and have a high risk for diabetes, consider being tested at a younger age or more often. Preventing infection Hepatitis B  If you have a higher risk for hepatitis B, you should be screened for this virus. You are considered at high risk for hepatitis B if: ? You were born in a country where hepatitis B is common. Ask your health care provider which countries are considered high risk. ? Your parents were born in a high-risk country, and you have not been immunized against hepatitis B (hepatitis B vaccine). ? You have HIV or AIDS. ? You use needles to inject street drugs. ? You live with someone who has hepatitis B. ? You have had sex with someone who has hepatitis B. ? You get hemodialysis treatment. ? You take certain medicines for conditions, including cancer, organ transplantation, and autoimmune conditions.  Hepatitis C  Blood testing is recommended for: ? Everyone born from [birth year] through 1965. ? Anyone with known risk factors for hepatitis C.  Sexually transmitted infections (STIs)  You should be  screened for sexually transmitted infections (STIs) including gonorrhea and chlamydia if: ? You are sexually active and are younger than 69 years of age. ? You are older than 69 years of age and your health care provider tells you that you are at risk for this type of infection. ? Your sexual activity has changed since you were last screened and you are at an increased risk for chlamydia or  gonorrhea. Ask your health care provider if you are at risk.  If you do not have HIV, but are at risk, it may be recommended that you take a prescription medicine daily to prevent HIV infection. This is called pre-exposure prophylaxis (PrEP). You are considered at risk if: ? You are sexually active and do not regularly use condoms or know the HIV status of your partner(s). ? You take drugs by injection. ? You are sexually active with a partner who has HIV.  Talk with your health care provider about whether you are at high risk of being infected with HIV. If you choose to begin PrEP, you should first be tested for HIV. You should then be tested every 3 months for as long as you are taking PrEP. Pregnancy  If you are premenopausal and you may become pregnant, ask your health care provider about preconception counseling.  If you may become pregnant, take 400 to 800 micrograms (mcg) of folic acid every day.  If you want to prevent pregnancy, talk to your health care provider about birth control (contraception). Osteoporosis and menopause  Osteoporosis is a disease in which the bones lose minerals and strength with aging. This can result in serious bone fractures. Your risk for osteoporosis can be identified using a bone density scan.  If you are 6 years of age or older, or if you are at risk for osteoporosis and fractures, ask your health care provider if you should be screened.  Ask your health care provider whether you should take a calcium or vitamin D supplement to lower your risk for  osteoporosis.  Menopause may have certain physical symptoms and risks.  Hormone replacement therapy may reduce some of these symptoms and risks. Talk to your health care provider about whether hormone replacement therapy is right for you. Follow these instructions at home:  Schedule regular health, dental, and eye exams.  Stay current with your immunizations.  Do not use any tobacco products including cigarettes, chewing tobacco, or electronic cigarettes.  If you are pregnant, do not drink alcohol.  If you are breastfeeding, limit how much and how often you drink alcohol.  Limit alcohol intake to no more than 1 drink per day for nonpregnant women. One drink equals 12 ounces of beer, 5 ounces of wine, or 1 ounces of hard liquor.  Do not use street drugs.  Do not share needles.  Ask your health care provider for help if you need support or information about quitting drugs.  Tell your health care provider if you often feel depressed.  Tell your health care provider if you have ever been abused or do not feel safe at home. This information is not intended to replace advice given to you by your health care provider. Make sure you discuss any questions you have with your health care provider. Document Released: 09/18/2010 Document Revised: 08/11/2015 Document Reviewed: 12/07/2014 Elsevier Interactive Patient Education  [employer].

## 2017-06-07 ENCOUNTER — Telehealth: Payer: Self-pay | Admitting: Family

## 2017-06-07 DIAGNOSIS — E559 Vitamin D deficiency, unspecified: Secondary | ICD-10-CM

## 2017-06-07 NOTE — Telephone Encounter (Signed)
Copied from CRM [id number]. Topic: Quick Communication - See Telephone Encounter >> Jun 07, 2017  4:41 PM Terisa Starraylor, Brittany L wrote: CRM for notification. See Telephone encounter for: 06/07/17.  Ally from the breast center said they received a referral for her. She stated the diagnosis code is wrong for the bone density. It is in there for something that they can not do. Please advise.  Call back is [phone] option 1 and then option 2 or can be changed in epic

## 2017-06-07 NOTE — Telephone Encounter (Signed)
Please advise 

## 2017-06-10 NOTE — Telephone Encounter (Signed)
Good morning Claris CheMargaret, [facility] They need a new diagnosis code. Thanks!

## 2017-06-10 NOTE — Telephone Encounter (Signed)
Changed dx code. She also has h/o osteopenia  If doesn't work, please ask what code.   Thanks as always

## 2017-06-13 ENCOUNTER — Encounter: Payer: Self-pay | Admitting: Family

## 2017-06-14 ENCOUNTER — Telehealth: Payer: Self-pay | Admitting: Family

## 2017-06-14 NOTE — Telephone Encounter (Signed)
Call pt  We need more info regarding her colonoscopy for records:    I meant to clarify- was your colonoscopy done in 2015? I cannot see results.   Did you have polyps? When were you told to return?   Thanks!

## 2017-06-21 ENCOUNTER — Other Ambulatory Visit: Payer: Self-pay | Admitting: Family

## 2017-06-21 DIAGNOSIS — E785 Hyperlipidemia, unspecified: Secondary | ICD-10-CM

## 2017-06-21 MED ORDER — OMEGA-3-ACID ETHYL ESTERS 1 G PO CAPS: 2.0000 g | ORAL_CAPSULE | Freq: Two times a day (BID) | ORAL | 3 refills | Status: DC

## 2017-06-21 NOTE — Progress Notes (Signed)
close

## 2017-07-08 ENCOUNTER — Telehealth: Payer: Self-pay | Admitting: Family

## 2017-07-08 NOTE — Telephone Encounter (Signed)
Call pt Samantha Ellison didn't read my mychart message  Ms Katrinka BlazingSmith,   I completely understand your concerns.   We can do a trial of prescription Omega 3 which I have sent to [facility].   Ensure you have a follow up appointment with me in 6 months so we can recheck you cholesterol as well.   Have a great weekend!   Claris CheMargaret

## 2017-07-09 NOTE — Telephone Encounter (Signed)
Pt called back and I told her Arnett's message and scheduled her a 6 month follow up appt.

## 2017-07-09 NOTE — Telephone Encounter (Signed)
Left voice mail to call back ok for PEC to speak to patient 

## 2017-07-11 ENCOUNTER — Other Ambulatory Visit: Payer: Self-pay | Admitting: Family

## 2017-07-11 DIAGNOSIS — M8580 Other specified disorders of bone density and structure, unspecified site: Secondary | ICD-10-CM

## 2017-07-12 ENCOUNTER — Ambulatory Visit
Admission: RE | Admit: 2017-07-12 | Discharge: 2017-07-12 | Disposition: A | Discharge status: Home / Self Care | Payer: Medicare Other | Source: Ambulatory Visit | Attending: Family | Admitting: Family

## 2017-07-12 ENCOUNTER — Other Ambulatory Visit: Payer: Medicare Other

## 2017-07-12 DIAGNOSIS — Z0000 Encounter for general adult medical examination without abnormal findings: Secondary | ICD-10-CM

## 2017-07-12 DIAGNOSIS — M8580 Other specified disorders of bone density and structure, unspecified site: Secondary | ICD-10-CM

## 2017-07-18 NOTE — Telephone Encounter (Signed)
Colonoscopy not due until 2020, last done 2016 Dr Markham Jordan ,  polyps were benign,

## 2017-07-19 NOTE — Telephone Encounter (Signed)
Abstracted to health maintenace

## 2017-11-29 IMAGING — CT CT HEAD W/O CM
1 series · 16 of 30 positions shown, 20 images · non-contrast
Comparison: None.

CLINICAL DATA: Evaluate for mass.  Confusion.

EXAM:
CT HEAD WITHOUT CONTRAST
TECHNIQUE: Contiguous axial images were obtained from the base of the skull
through the vertex without intravenous contrast.

[Series 2: head wo · axial · 0.42mm/px · z∈[-84,+51]mm · 16 of 31 slices shown, 20 images]
[im 2/31  brain]
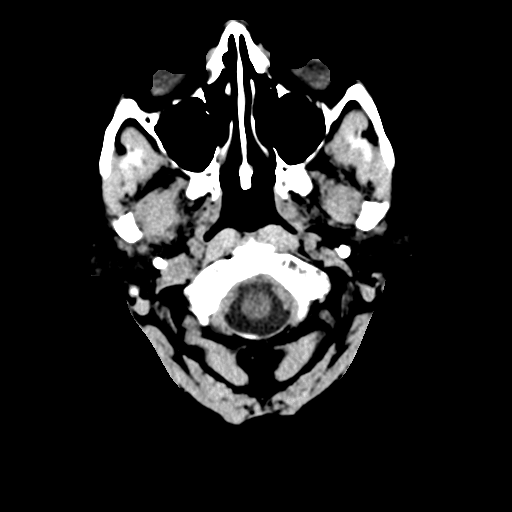
[im 2/31  bone]
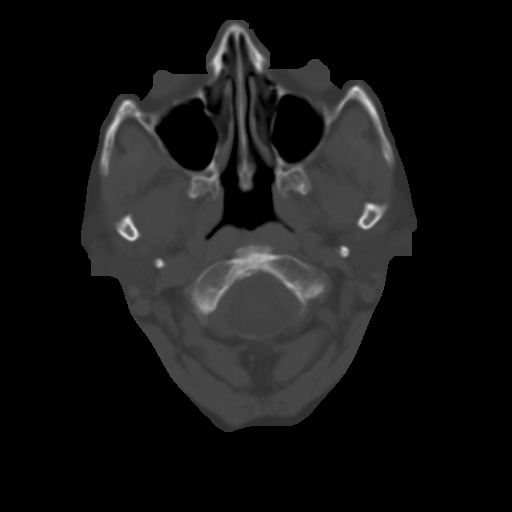
[im 4/31  brain]
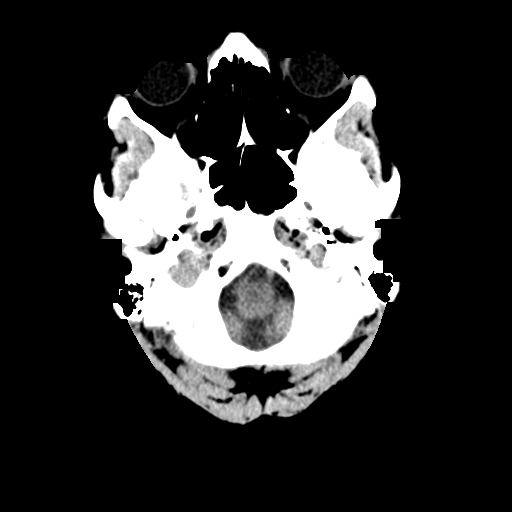
[im 6/31  brain]
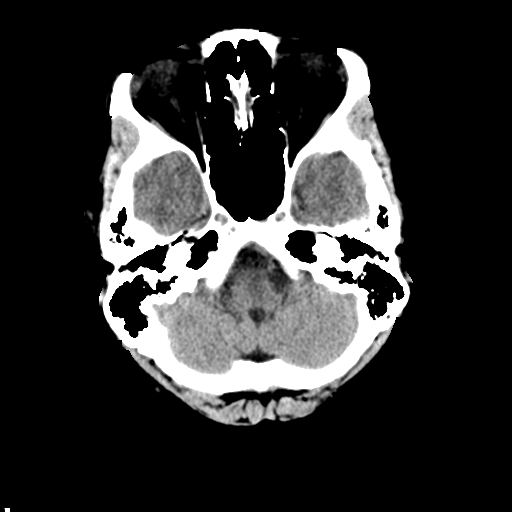
[im 8/31  brain]
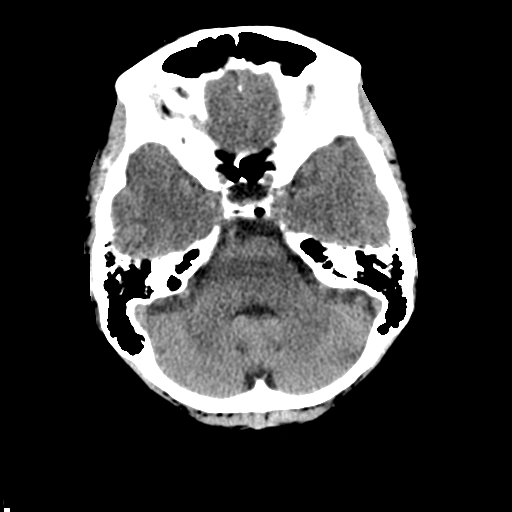
[im 9/31  brain]
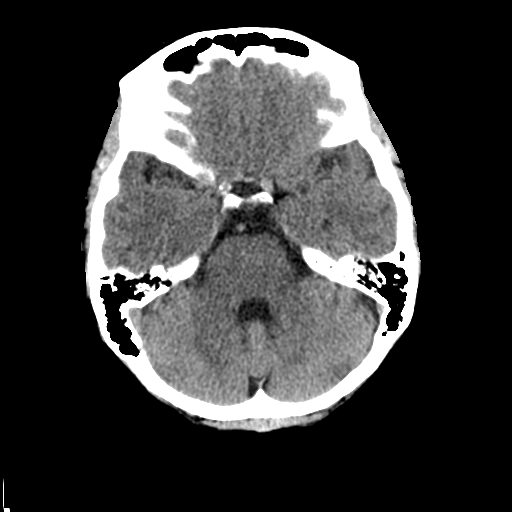
[im 9/31  bone]
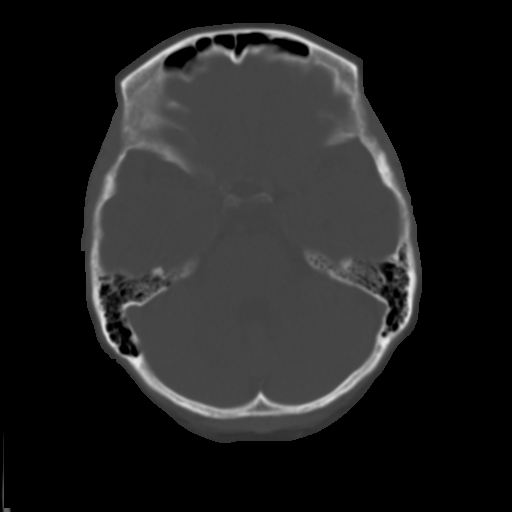
[im 11/31  brain]
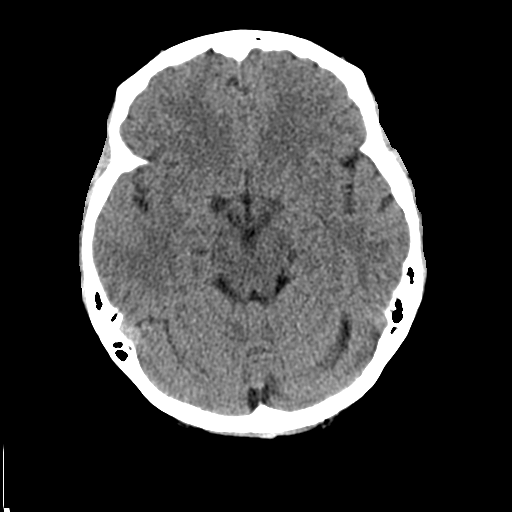
[im 13/31  brain]
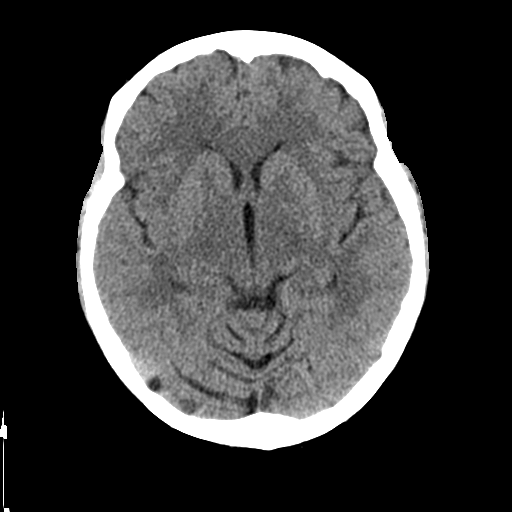
[im 15/31  brain]
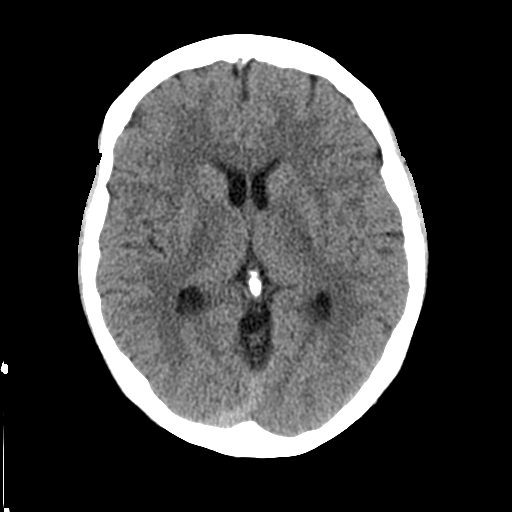
[im 16/31  brain]
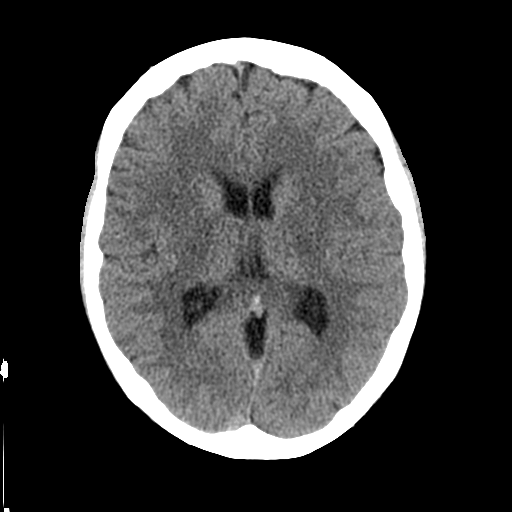
[im 16/31  bone]
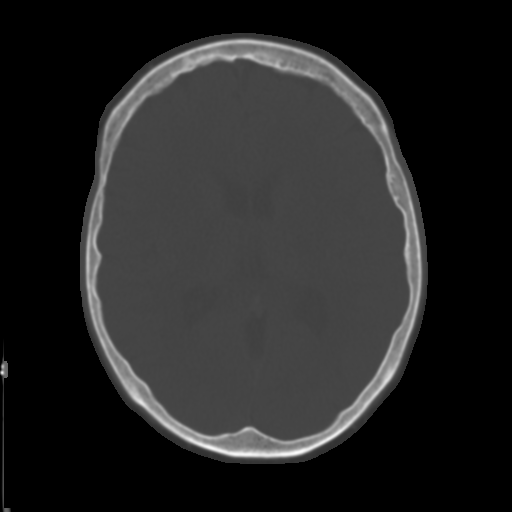
[im 18/31  brain]
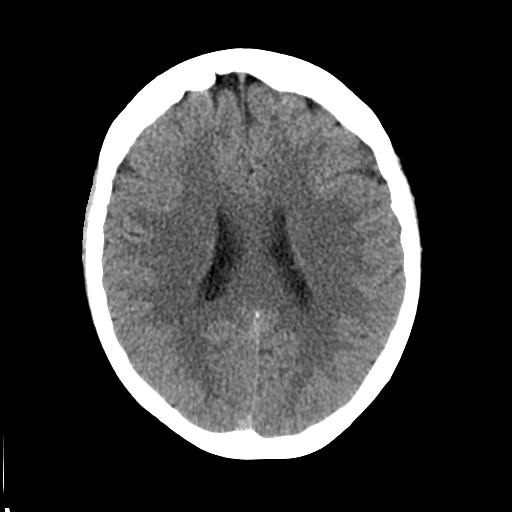
[im 20/31  brain]
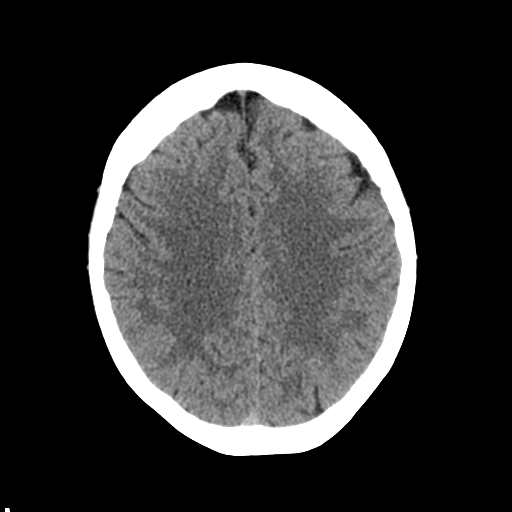
[im 22/31  brain]
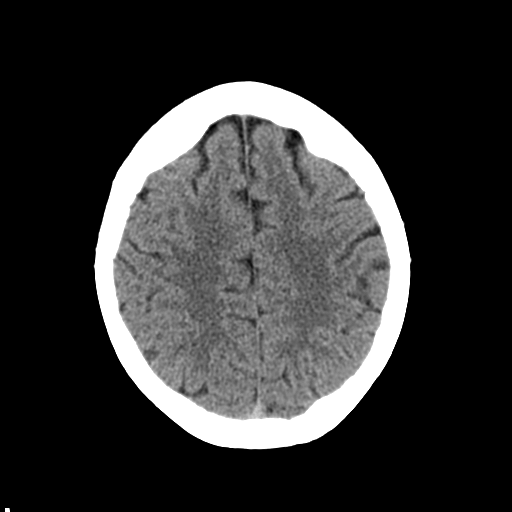
[im 23/31  brain]
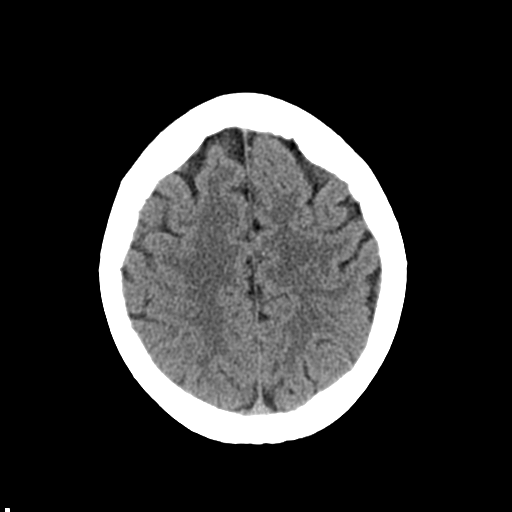
[im 23/31  bone]
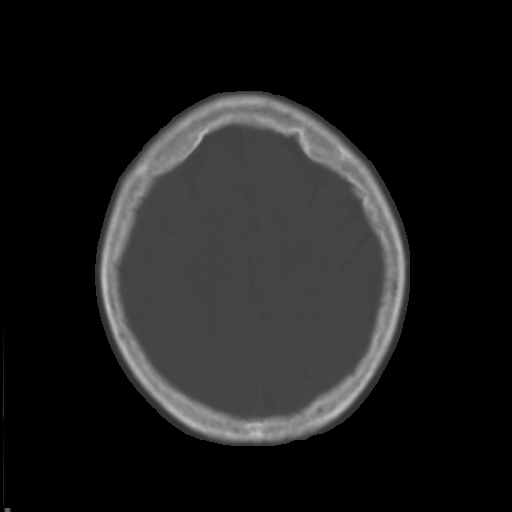
[im 25/31  brain]
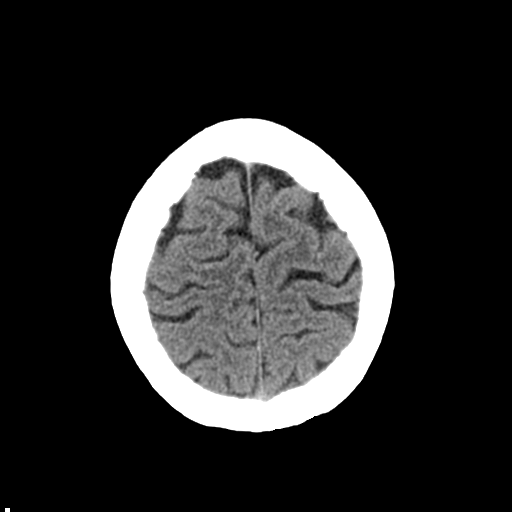
[im 27/31  brain]
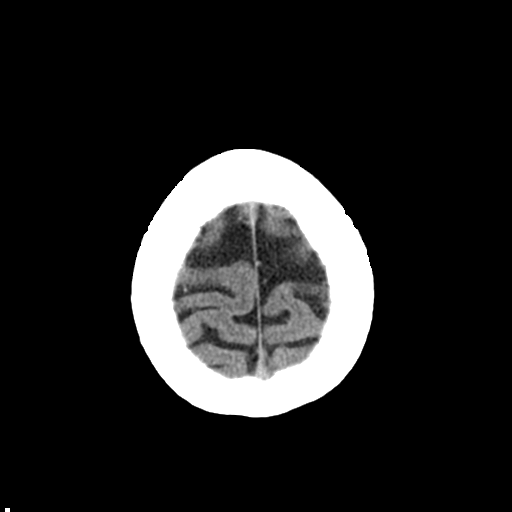
[im 29/31  brain]
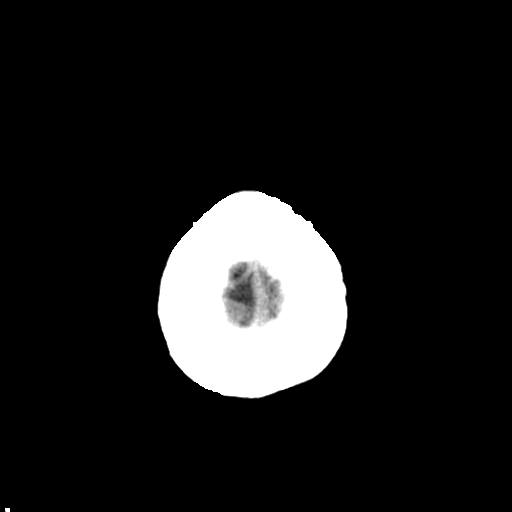

[16 of 30 positions shown; findings below may reference images not displayed]

FINDINGS: Brain: No acute intracranial abnormality. Specifically, no
hemorrhage, hydrocephalus, mass lesion, acute infarction, or
significant intracranial injury.

Vascular: No hyperdense vessel or unexpected calcification.

Skull: No acute calvarial abnormality

Sinuses/Orbits: Visualized paranasal sinuses and mastoids clear.
Orbital soft tissues unremarkable.

Other: None
IMPRESSION: No acute intracranial abnormality.

## 2017-12-11 ENCOUNTER — Other Ambulatory Visit: Payer: Self-pay | Admitting: Family

## 2017-12-11 DIAGNOSIS — E785 Hyperlipidemia, unspecified: Secondary | ICD-10-CM

## 2017-12-16 ENCOUNTER — Ambulatory Visit: Admission: RE | Admit: 2017-12-16 | Payer: Medicare Other | Source: Ambulatory Visit

## 2017-12-16 ENCOUNTER — Telehealth: Payer: Self-pay | Admitting: Family

## 2017-12-16 DIAGNOSIS — Z13820 Encounter for screening for osteoporosis: Secondary | ICD-10-CM

## 2017-12-16 NOTE — Telephone Encounter (Signed)
Copied from CRM [id number]. Topic: Quick Communication - See Telephone Encounter >> Dec 16, 2017  8:26 AM Harlan Stains wrote: Olegario Messier from [facility] is calling in stating that patients bone density was last done at [facility] in 2017. She has a appt at the breast center at 2pm. Olegario Messier stated they can do it but it will be a new baseline.  Cb# [phone] may leave voicemail

## 2017-12-16 NOTE — Telephone Encounter (Signed)
They need to know something by 12:00 today.

## 2017-12-16 NOTE — Telephone Encounter (Signed)
Call GI  I have ordered bone density ( this is the second time , it was ordered 07/12/17)  Please confirm that mammogram order is good and patient can have that done

## 2017-12-17 NOTE — Telephone Encounter (Signed)
9/30/19Spoke with Clydie Braun at [facility]  and they are unable to compare previous DEXA scan . Test has to be done on same machine in order to compare films. Patient will need to go where she had scan previously done which was [facility].  Patient was there for appointment she will inform patient.

## 2017-12-18 NOTE — Telephone Encounter (Signed)
Please sch dexa at [facility] please and inform patient  thanks

## 2017-12-19 NOTE — Telephone Encounter (Addendum)
Left voice mail for patient to call back ok for PEC to speak to patient    Appointment scheduled [name] 01/16/18@10 :00am

## 2017-12-20 ENCOUNTER — Ambulatory Visit: Payer: Medicare Other | Admitting: Family

## 2017-12-27 ENCOUNTER — Ambulatory Visit: Payer: Medicare Other | Admitting: Family

## 2017-12-27 ENCOUNTER — Encounter: Payer: Self-pay | Admitting: Family

## 2017-12-27 VITALS — BP 130/85 | HR 64 | Temp 98.0°F | Resp 14 | Wt 179.8 lb

## 2017-12-27 DIAGNOSIS — E785 Hyperlipidemia, unspecified: Secondary | ICD-10-CM

## 2017-12-27 DIAGNOSIS — R030 Elevated blood-pressure reading, without diagnosis of hypertension: Secondary | ICD-10-CM | POA: Diagnosis not present

## 2017-12-27 DIAGNOSIS — Z23 Encounter for immunization: Secondary | ICD-10-CM | POA: Diagnosis not present

## 2017-12-27 LAB — COMPREHENSIVE METABOLIC PANEL
ALBUMIN: 4.2 g/dL (ref 3.5–5.2)
ALT: 16 U/L (ref 0–35)
AST: 17 U/L (ref 0–37)
Alkaline Phosphatase: 88 U/L (ref 39–117)
BUN: 16 mg/dL (ref 6–23)
CALCIUM: 9.7 mg/dL (ref 8.4–10.5)
CHLORIDE: 107 meq/L (ref 96–112)
CO2: 27 mEq/L (ref 19–32)
CREATININE: 0.77 mg/dL (ref 0.40–1.20)
GFR: 78.87 mL/min (ref >60.00)
Glucose, Bld: 93 mg/dL (ref 70–99)
Potassium: 4.2 mEq/L (ref 3.5–5.1)
Sodium: 142 mEq/L (ref 135–145)
Total Bilirubin: 0.8 mg/dL (ref 0.2–1.2)
Total Protein: 6.8 g/dL (ref 6.0–8.3)

## 2017-12-27 LAB — LIPID PANEL
CHOL/HDL RATIO: 4
CHOLESTEROL: 192 mg/dL (ref 0–200)
HDL: 47.3 mg/dL (ref >39.00)
LDL CALC: 114 mg/dL — AB (ref 0–99)
NonHDL: 145.11
TRIGLYCERIDES: 156 mg/dL — AB (ref 0.0–149.0)
VLDL: 31.2 mg/dL (ref 0.0–40.0)

## 2017-12-27 NOTE — Assessment & Plan Note (Signed)
Pending lipid panel. If no improvement on lovaza, patient agrees to start statin.

## 2017-12-27 NOTE — Patient Instructions (Addendum)
Monitor blood pressure,  Goal is less than 120/80; if persistently higher, please make sooner follow up appointment so we can recheck you blood pressure and manage medications  Labs

## 2017-12-27 NOTE — Progress Notes (Signed)
Subjective:    Patient ID: Samantha Ellison, female    DOB: 10/14/1950, 67 y.o.   MRN: [id number]  CC: Samantha Ellison is a 67 y.o. female who presents today for follow up.   HPI: Feels well today. No complaints.   HLD- on lovaza. Family h/o HLD. Improved diet. Doing water aerobics. Clothes fit better.   No cp, sob with exercise.   No h/o HTN.    HISTORY:  Past Medical History:  Diagnosis Date  . Arthritis   . Arthritis of right knee   . Bilateral ankle fractures   . Fibrocystic breast disease   . History of chicken pox   . Seizures (HCC)    Past Surgical History:  Procedure Laterality Date  . DILATION AND CURETTAGE OF UTERUS    . VAGINAL DELIVERY     Family History  Problem Relation Age of Onset  . Hyperlipidemia Mother   . Hypertension Mother   . COPD Mother   . Aneurysm Mother   . Hypertension Father   . COPD Father   . Depression Daughter   . Heart disease Brother        CAD s/p stents    Allergies: Compazine [prochlorperazine edisylate] Current Outpatient Medications on File Prior to Visit  Medication Sig Dispense Refill  . Calcium 600-200 MG-UNIT tablet Take 1 tablet by mouth daily.    . Cholecalciferol (VITAMIN D-1000 MAX ST) 1000 units tablet Take by mouth.    . levETIRAcetam (KEPPRA) 500 MG tablet Take 1 tablet (500 mg total) by mouth 2 (two) times daily. 60 tablet 1  . Multiple Vitamins-Minerals (ICAPS MV PO) Take 1 tablet by mouth.    . omega-3 acid ethyl esters (LOVAZA) 1 g capsule Take 2 capsules (2 g total) by mouth 2 (two) times daily. 120 capsule 3  . Probiotic Product (PROBIOTIC-10) CAPS Take by mouth daily.    . TURMERIC PO Take 300 mg by mouth daily.     . vitamin B-12 (CYANOCOBALAMIN) 1000 MCG tablet Take by mouth.     No current facility-administered medications on file prior to visit.     Social History   Tobacco Use  . Smoking status: Never Smoker  . Smokeless tobacco: Never Used  Substance Use Topics  . Alcohol use: No  . Drug  use: No    Review of Systems  Constitutional: Negative for chills and fever.  Respiratory: Negative for cough and shortness of breath.   Cardiovascular: Negative for chest pain and palpitations.  Gastrointestinal: Negative for nausea and vomiting.      Objective:    BP 130/85   Pulse 64   Temp 98 F (36.7 C) (Oral)   Resp 14   Wt 179 lb 12 oz (81.5 kg)   LMP  (LMP Unknown)   SpO2 98%   BMI 29.91 kg/m  BP Readings from Last 3 Encounters:  12/27/17 130/85  05/31/17 122/80  04/23/17 128/80   Wt Readings from Last 3 Encounters:  12/27/17 179 lb 12 oz (81.5 kg)  05/31/17 182 lb (82.6 kg)  04/23/17 182 lb (82.6 kg)    Physical Exam  Constitutional: She appears well-developed and well-nourished.  Eyes: Conjunctivae are normal.  Cardiovascular: Normal rate, regular rhythm, normal heart sounds and normal pulses.  Pulmonary/Chest: Effort normal and breath sounds normal. She has no wheezes. She has no rhonchi. She has no rales.  Neurological: She is alert.  Skin: Skin is warm and dry.  Psychiatric: She has a normal  mood and affect. Her speech is normal and behavior is normal. Thought content normal.  Vitals reviewed.      Assessment & Plan:   Problem List Items Addressed This Visit      Other   Elevated blood pressure reading    Elevated today. Pending readings from home from patient so we can decide if she needs to start medication therapy. She will send readings via mychart      HLD (hyperlipidemia) - Primary    Pending lipid panel. If no improvement on lovaza, patient agrees to start statin.       Relevant Orders   Lipid panel   Comprehensive metabolic panel       I am having Samantha Ellison maintain her TURMERIC PO, levETIRAcetam, Calcium, Cholecalciferol, vitamin B-12, Multiple Vitamins-Minerals (ICAPS MV PO), omega-3 acid ethyl esters, and PROBIOTIC-10.   No orders of the defined types were placed in this encounter.   Return precautions given.    Risks, benefits, and alternatives of the medications and treatment plan prescribed today were discussed, and patient expressed understanding.   Education regarding symptom management and diagnosis given to patient on AVS.  Continue to follow with Samantha Grana, FNP for routine health maintenance.   Samantha Ellison and I agreed with plan.   Samantha Plowman, FNP

## 2017-12-27 NOTE — Assessment & Plan Note (Signed)
Elevated today. Pending readings from home from patient so we can decide if she needs to start medication therapy. She will send readings via mychart

## 2017-12-28 ENCOUNTER — Other Ambulatory Visit: Payer: Self-pay | Admitting: Family

## 2017-12-31 ENCOUNTER — Telehealth: Payer: Self-pay

## 2017-12-31 MED ORDER — PRAVASTATIN SODIUM 20 MG PO TABS: 20.0000 mg | ORAL_TABLET | Freq: Every day | ORAL | 0 refills | Status: DC

## 2017-12-31 NOTE — Telephone Encounter (Signed)
Verbal orders for script  , see lab results

## 2018-01-03 NOTE — Telephone Encounter (Signed)
Patient aware of appointment

## 2018-01-16 ENCOUNTER — Ambulatory Visit
Admission: RE | Admit: 2018-01-16 | Discharge: 2018-01-16 | Disposition: A | Discharge status: Home / Self Care | Payer: Medicare Other | Source: Ambulatory Visit | Attending: Family | Admitting: Family

## 2018-01-16 DIAGNOSIS — M85852 Other specified disorders of bone density and structure, left thigh: Secondary | ICD-10-CM | POA: Insufficient documentation

## 2018-01-16 DIAGNOSIS — M8588 Other specified disorders of bone density and structure, other site: Secondary | ICD-10-CM | POA: Insufficient documentation

## 2018-01-16 DIAGNOSIS — Z13820 Encounter for screening for osteoporosis: Secondary | ICD-10-CM | POA: Diagnosis present

## 2018-03-29 ENCOUNTER — Other Ambulatory Visit: Payer: Self-pay | Admitting: Family

## 2018-04-24 ENCOUNTER — Ambulatory Visit: Payer: Medicare Other

## 2018-04-24 VITALS — BP 138/72 | HR 72 | Temp 98.4°F | Resp 15 | Ht 65.0 in | Wt 184.8 lb

## 2018-04-24 DIAGNOSIS — Z0000 Encounter for general adult medical examination without abnormal findings: Secondary | ICD-10-CM | POA: Diagnosis not present

## 2018-04-24 NOTE — Patient Instructions (Addendum)
  Ms. Samantha Ellison , Thank you for taking time to come for your Medicare Wellness Visit. I appreciate your ongoing commitment to your health goals. Please review the following plan we discussed and let me know if I can assist you in the future.   These are the goals we discussed: Goals      Patient Stated   . DIET - Snack Healthy (pt-stated)      Other   . Weight (lb) < 180 lb (81.6 kg)       This is a list of the screening recommended for you and due dates:  Health Maintenance  Topic Date Due  . Colon Cancer Screening  03/19/2018  . Mammogram  07/13/2019  . Tetanus Vaccine  02/03/2025  . Flu Shot  Completed  . DEXA scan (bone density measurement)  Completed  .  Hepatitis C: One time screening is recommended by Center for Disease Control  (CDC) for  adults born from [birth year] through 1967.   Completed  . Pneumonia vaccines  Completed

## 2018-04-24 NOTE — Progress Notes (Signed)
Subjective:   GWENDOLINE LESKO is a 68 y.o. female who presents for Medicare Annual (Subsequent) preventive examination.  Review of Systems:  No ROS.  Medicare Wellness Visit. Additional risk factors are reflected in the social history. Cardiac Risk Factors include: advanced age (>73men, >36 women);hypertension     Objective:     Vitals: BP 138/72 (BP Location: Left Arm, Patient Position: Sitting, Cuff Size: Normal)   Pulse 72   Temp 98.4 F (36.9 C) (Oral)   Resp 15   Ht 5\' 5"  (1.651 m)   Wt 184 lb 12.8 oz (83.8 kg)   LMP  (LMP Unknown)   SpO2 97%   BMI 30.75 kg/m   Body mass index is 30.75 kg/m.  Advanced Directives 04/24/2018 04/23/2017 04/20/2016 02/27/2016 12/18/2015 11/17/2014  Does Patient Have a Medical Advance Directive? Yes No No Yes No Yes  Type of Estate agent of [place];Living will - - Living will;Healthcare Power of Attorney - Healthcare Power of Attorney  Does patient want to make changes to medical advance directive? No - Patient declined Yes (MAU/Ambulatory/Procedural Areas - Information given) - - - Yes - information given  Copy of Healthcare Power of Attorney in Chart? No - copy requested - - - - Yes  Would patient like information on creating a medical advance directive? - - Yes (MAU/Ambulatory/Procedural Areas - Information given) - - -    Tobacco Social History   Tobacco Use  Smoking Status Never Smoker  Smokeless Tobacco Never Used     Counseling given: Not Answered   Clinical Intake:  Pre-visit preparation completed: Yes  Pain : No/denies pain     Diabetes: No  How often do you need to have someone help you when you read instructions, pamphlets, or other written materials from your doctor or pharmacy?: 1 - Never  Interpreter Needed?: No     Past Medical History:  Diagnosis Date  . Arthritis   . Arthritis of right knee   . Bilateral ankle fractures   . Fibrocystic breast disease   . History of chicken pox   .  Seizures (HCC)    Past Surgical History:  Procedure Laterality Date  . DILATION AND CURETTAGE OF UTERUS    . VAGINAL DELIVERY     Family History  Problem Relation Age of Onset  . Hyperlipidemia Mother   . Hypertension Mother   . COPD Mother   . Aneurysm Mother   . Hypertension Father   . COPD Father   . Depression Daughter   . Heart disease Brother        CAD s/p stents   Social History   Socioeconomic History  . Marital status: Married    Spouse name: Not on file  . Number of children: Not on file  . Years of education: Not on file  . Highest education level: Not on file  Occupational History  . Not on file  Social Needs  . Financial resource strain: Not hard at all  . Food insecurity:    Worry: Never true    Inability: Never true  . Transportation needs:    Medical: No    Non-medical: No  Tobacco Use  . Smoking status: Never Smoker  . Smokeless tobacco: Never Used  Substance and Sexual Activity  . Alcohol use: No  . Drug use: No  . Sexual activity: Not Currently  Lifestyle  . Physical activity:    Days per week: 4 days    Minutes per session:  60 min  . Stress: Not at all  Relationships  . Social connections:    Talks on phone: Not on file    Gets together: Not on file    Attends religious service: Not on file    Active member of club or organization: Not on file    Attends meetings of clubs or organizations: Not on file    Relationship status: Not on file  Other Topics Concern  . Not on file  Social History Narrative   L[place] in [place] with [place]. Has 2 cats.      Work - Retired Adult nursemedical transcription [employer]      Diet - regular      Exercise - [employer], walking, limited by hip pain. Yoga twice weekly. Gardening      Enjoy 'pick your own peaches, blueberries, apples.' [place].         Outpatient Encounter Medications as of 04/24/2018  Medication Sig  . Cholecalciferol (VITAMIN D-1000 MAX ST) 1000 units tablet Take  by mouth.  . levETIRAcetam (KEPPRA) 500 MG tablet Take 1 tablet (500 mg total) by mouth 2 (two) times daily.  . Melatonin 3 MG TABS Take 1 tablet by mouth as needed.  . minoxidil (LONITEN) 2.5 MG tablet Take 2.5 mg by mouth daily. Take 1/2 tablet daily  . omega-3 acid ethyl esters (LOVAZA) 1 g capsule Take 2 capsules (2 g total) by mouth 2 (two) times daily.  . pravastatin (PRAVACHOL) 20 MG tablet TAKE 1 TABLET BY MOUTH EVERY DAY  . Probiotic Product (PROBIOTIC-10) CAPS Take by mouth daily.  . vitamin B-12 (CYANOCOBALAMIN) 1000 MCG tablet Take by mouth.  . Calcium 600-200 MG-UNIT tablet Take 1 tablet by mouth daily.  . [DISCONTINUED] Multiple Vitamins-Minerals (ICAPS MV PO) Take 1 tablet by mouth.  . [DISCONTINUED] TURMERIC PO Take 300 mg by mouth daily.    No facility-administered encounter medications on file as of 04/24/2018.     Activities of Daily Living In your present state of health, do you have any difficulty performing the following activities: 04/24/2018  Hearing? Y  Comment Hearing aids  Vision? N  Difficulty concentrating or making decisions? N  Walking or climbing stairs? N  Dressing or bathing? N  Doing errands, shopping? N  Preparing Food and eating ? N  Using the Toilet? N  In the past six months, have you accidently leaked urine? N  Do you have problems with loss of bowel control? N  Managing your Medications? N  Managing your Finances? N  Housekeeping or managing your Housekeeping? N  Some recent data might be hidden    Patient Care Team: Allegra GranaArnett, Margaret G, FNP as PCP - General (Family Medicine)    Assessment:   This is a routine wellness examination for Vance GatherShelley.  Health Screenings  Mammogram -07/12/17 Dexa Scan- 01/16/18 Colonoscopy -08/27/13 Glaucoma -none Hearing- hearing aids HgB A1C- 05/17/17 (5.7) TSH- 05/17/17 (4.32) Lipid-12/27/17 (224)  Social  Alcohol intake -no Smoking history- never Smokers in home? none Illicit drug use? none Exercise  -water aerobics, stationary bike 3-4 days weekly, 60 minutes Diet-regular Sexually Active -not currently  Safety  Patient feels safe at home.  Patient does have smoke detectors at home  Patient does wear sunscreen or protective clothing when in direct sunlight  Patient does wear seat belt when driving or riding with others.   Activities of Daily Living Patient can do their own household chores. Denies needing assistance with: driving, feeding themselves, getting from  bed to chair, getting to the toilet, bathing/showering, dressing, managing money, climbing flight of stairs, or preparing meals.   Depression Screen Patient denies losing interest in daily life, feeling hopeless, or crying easily over simple problems.  Fall Screen Patient denies being afraid of falling or falling in the last year.   Memory Screen Patient denies problems with memory, misplacing items, and is able to balance checkbook/bank accounts.  Patient is alert, normal appearance, oriented to person/place/and time. Correctly identified the president of the [place], recall of 2/3 objects, and performing simple calculations.  Patient displays appropriate judgement and can read correct time from watch face.   Immunizations The following Immunizations are up to date: Influenza, shingles, pneumonia, and tetanus.   Other Providers Patient Care Team: Allegra Grana, FNP as PCP - General (Family Medicine)  Exercise Activities and Dietary recommendations Current Exercise Habits: Home exercise routine, Type of exercise: stretching;calisthenics(water aerobics), Time (Minutes): 60, Frequency (Times/Week): 4, Weekly Exercise (Minutes/Week): 240, Intensity: Moderate  Goals      Patient Stated   . DIET - Snack Healthy (pt-stated)      Other   . Weight (lb) < 180 lb (81.6 kg)       Fall Risk Fall Risk  04/24/2018 04/23/2017 04/20/2016 12/21/2015 11/16/2015  Falls in the past year? 0 No No No No   Depression Screen PHQ 2/9  Scores 04/24/2018 04/23/2017 04/20/2016 12/21/2015  PHQ - 2 Score 0 0 0 0     Cognitive Function MMSE - Mini Mental State Exam 04/23/2017 11/17/2014  Not completed: Refused -  Orientation to time - 5  Orientation to Place - 5  Registration - 3  Attention/ Calculation - 5  Recall - 3  Language- name 2 objects - 2  Language- repeat - 1  Language- follow 3 step command - 3  Language- read & follow direction - 1  Write a sentence - 1  Copy design - 1  Total score - 30     6CIT Screen 04/24/2018 04/20/2016  What Year? 0 points 0 points  What month? 0 points 0 points  What time? 0 points 0 points  Count back from 20 0 points 0 points  Months in reverse 0 points 0 points  Repeat phrase 0 points 0 points  Total Score 0 0    Immunization History  Administered Date(s) Administered  . Influenza, High Dose Seasonal PF 11/17/2014, 12/27/2017  . Influenza,inj,Quad PF,6+ Mos 11/16/2015  . Influenza-Unspecified 12/27/2012, 01/02/2014, 01/01/2017  . Pneumococcal Conjugate-13 08/27/2013  . Pneumococcal Polysaccharide-23 01/30/2012, 05/31/2017  . Tdap 12/27/2008, 02/04/2015  . Zoster 12/27/2012   Screening Tests Health Maintenance  Topic Date Due  . COLONOSCOPY  03/19/2018  . MAMMOGRAM  07/13/2019  . TETANUS/TDAP  02/03/2025  . INFLUENZA VACCINE  Completed  . DEXA SCAN  Completed  . Hepatitis C Screening  Completed  . PNA vac Low Risk Adult  Completed      Plan:    End of life planning; Advance aging; Advanced directives discussed. Copy of current HCPOA/Living Will requested.    I have personally reviewed and noted the following in the patient's chart:   . Medical and social history . Use of alcohol, tobacco or illicit drugs  . Current medications and supplements . Functional ability and status . Nutritional status . Physical activity . Advanced directives . List of other physicians . Hospitalizations, surgeries, and ER visits in previous 12 months . Vitals . Screenings to include  cognitive, depression, and falls .  Referrals and appointments  In addition, I have reviewed and discussed with patient certain preventive protocols, quality metrics, and best practice recommendations. A written personalized care plan for preventive services as well as general preventive health recommendations were provided to patient.     Ashok PallOBrien-Blaney, Atisha Hamidi L, LPN  1/3/08652/08/2018   Reviewed above information.  Agree with assessment and plan.    Dr Lorin PicketScott

## 2018-05-12 ENCOUNTER — Ambulatory Visit: Payer: Self-pay

## 2018-05-12 NOTE — Telephone Encounter (Signed)
Husband reports pt. Started having flu symptoms this past Friday. Has dry cough, body aches,chills, temp. 99. Eating and drinking well. Will give tylenol/ibu for temp. And aches. Pt. Has an appointment for tomorrow already.Reviewed home remedies.Instructed if symptoms worsen to got to ED. Verbalizes understanding.  Answer Assessment - Initial Assessment Questions 1. WORST SYMPTOM: "What is your worst symptom?" (e.g., cough, runny nose, muscle aches, headache, sore throat, fever)      Body aches,shivers, temp. 99 2. ONSET: "When did your flu symptoms start?"      Friday 3. COUGH: "How bad is the cough?"       Dry cough 4. RESPIRATORY DISTRESS: "Describe your breathing."      No 5. FEVER: "Do you have a fever?" If so, ask: "What is your temperature, how was it measured, and when did it start?"     99 6. EXPOSURE: "Were you exposed to someone with influenza?"       [facility] 7. FLU VACCINE: "Did you get a flu shot this year?"     Yes 8. HIGH RISK DISEASE: "Do you any chronic medical problems?" (e.g., heart or lung disease, asthma, weak immune system, or other HIGH RISK conditions)     Seizures 9. PREGNANCY: "Is there any chance you are pregnant?" "When was your last menstrual period?"     No 10. OTHER SYMPTOMS: "Do you have any other symptoms?"  (e.g., runny nose, muscle aches, headache, sore throat)       Body aches  Protocols used: INFLUENZA - SEASONAL-A-AH

## 2018-05-12 NOTE — Telephone Encounter (Signed)
FYI Pt has appt tomorrow Husband reports pt. Started having flu symptoms this past Friday. Has dry cough, body aches,chills, temp. 99. Eating and drinking well. Will give tylenol/ibu for temp. And aches. Pt. Has an appointment for tomorrow already.Reviewed home remedies.Instructed if symptoms worsen to got to ED. Verbalizes understanding.

## 2018-05-13 ENCOUNTER — Encounter: Payer: Self-pay | Admitting: Family Medicine

## 2018-05-13 ENCOUNTER — Ambulatory Visit: Payer: Medicare Other | Admitting: Family Medicine

## 2018-05-13 VITALS — BP 90/60 | HR 108 | Temp 100.9°F | Resp 20 | Ht 64.0 in | Wt 180.8 lb

## 2018-05-13 DIAGNOSIS — B349 Viral infection, unspecified: Secondary | ICD-10-CM

## 2018-05-13 DIAGNOSIS — R05 Cough: Secondary | ICD-10-CM | POA: Diagnosis not present

## 2018-05-13 DIAGNOSIS — R509 Fever, unspecified: Secondary | ICD-10-CM

## 2018-05-13 DIAGNOSIS — R112 Nausea with vomiting, unspecified: Secondary | ICD-10-CM

## 2018-05-13 DIAGNOSIS — R059 Cough, unspecified: Secondary | ICD-10-CM

## 2018-05-13 LAB — POC INFLUENZA A&B (BINAX/QUICKVUE)
INFLUENZA B, POC: NEGATIVE
Influenza A, POC: NEGATIVE

## 2018-05-13 MED ORDER — ONDANSETRON 4 MG PO TBDP: 4.0000 mg | ORAL_TABLET | Freq: Three times a day (TID) | ORAL | 0 refills | Status: DC | PRN

## 2018-05-13 NOTE — Patient Instructions (Addendum)
Use tylenol 1000 mg every 8 hours as needed to reduce fever. Can also alternate this with 400 mg of ibuprofen if needed every 8 hours.     Viral Illness, Adult Viruses are tiny germs that can get into a person's body and cause illness. There are many different types of viruses, and they cause many types of illness. Viral illnesses can range from mild to severe. They can affect various parts of the body. Common illnesses that are caused by a virus include colds and the flu.   What are the causes? Many types of viruses can cause illness. Viruses invade cells in your body, multiply, and cause the infected cells to malfunction or die. When the cell dies, it releases more of the virus. When this happens, you develop symptoms of the illness, and the virus continues to spread to other cells. If the virus takes over the function of the cell, it can cause the cell to divide and grow out of control, as is the case when a virus causes cancer. Different viruses get into the body in different ways. You can get a virus by:  Swallowing food or water that is contaminated with the virus.  Breathing in droplets that have been coughed or sneezed into the air by an infected person.  Touching a surface that has been contaminated with the virus and then touching your eyes, nose, or mouth.  If a virus enters your body, your body's defense system (immune system) will try to fight the virus. You may be at higher risk for a viral illness if your immune system is weak. What are the signs or symptoms? Symptoms vary depending on the type of virus and the location of the cells that it invades. Common symptoms of the main types of viral illnesses include: Cold and flu viruses  Fever.  Headache.  Sore throat.  Muscle aches.  Nasal congestion.  Cough. Digestive system (gastrointestinal) viruses  Fever.  Abdominal pain.  Nausea.  Diarrhea.  How is this treated? Viruses can be difficult to treat because  they live within cells. Antibiotic medicines do not treat viruses because these drugs do not get inside cells. Treatment for a viral illness may include:  Resting and drinking plenty of fluids.  Medicines to relieve symptoms. These can include over-the-counter medicine for pain and fever, medicines for cough or congestion, and medicines to relieve diarrhea.  Antiviral medicines. These drugs are available only for certain types of viruses. They may help reduce flu symptoms if taken early. There are also many antiviral medicines for hepatitis and HIV/AIDS. Some viral illnesses can be prevented with vaccinations. A common example is the flu shot. Follow these instructions at home: Medicines   Take over-the-counter and prescription medicines only as told by your health care provider.  If you were prescribed an antiviral medicine, take it as told by your health care provider. Do not stop taking the medicine even if you start to feel better.  Be aware of when antibiotics are needed and when they are not needed. Antibiotics do not treat viruses. If your health care provider thinks that you may have a bacterial infection as well as a viral infection, you may get an antibiotic. ? Do not ask for an antibiotic prescription if you have been diagnosed with a viral illness. That will not make your illness go away faster. ? Frequently taking antibiotics when they are not needed can lead to antibiotic resistance. When this develops, the medicine no longer works against the  bacteria that it normally fights. General instructions  Drink enough fluids to keep your urine clear or pale yellow.  Rest as much as possible.  Return to your normal activities as told by your health care provider. Ask your health care provider what activities are safe for you.  Keep all follow-up visits as told by your health care provider. This is important. How is this prevented? Take these actions to reduce your risk of viral  infection:  Eat a healthy diet and get enough rest.  Wash your hands often with soap and water. This is especially important when you are in public places. If soap and water are not available, use hand sanitizer.  Avoid close contact with friends and family who have a viral illness.  If you travel to areas where viral gastrointestinal infection is common, avoid drinking water or eating raw food.  Keep your immunizations up to date. Get a flu shot every year as told by your health care provider.  Do not share toothbrushes, nail clippers, razors, or needles with other people.  Always practice safe sex.  Contact a health care provider if:  You have symptoms of a viral illness that do not go away.  Your symptoms come back after going away.  Your symptoms get worse. Get help right away if:  You have trouble breathing.  You have a severe headache or a stiff neck.  You have severe vomiting or abdominal pain. This information is not intended to replace advice given to you by your health care provider. Make sure you discuss any questions you have with your health care provider. Document Released: 07/15/2015 Document Revised: 08/17/2015 Document Reviewed: 07/15/2015 Elsevier Interactive Patient Education  2019 [employer].

## 2018-05-13 NOTE — Progress Notes (Signed)
Subjective:    Patient ID: Samantha Ellison, female    DOB: 11/13/1950, 68 y.o.   MRN: [id number]  HPI   Patient presents to clinic complaining of fever, chills, feeling weak and achy, nausea and vomiting for 1 day.  Patient also complains of a cough and some congestion that has been present for 2 weeks however the cough and congestion seem to be improving and the fever, chills and weakness all began yesterday.  Patient states the fever came on suddenly and out of nowhere, threw up x1 this morning and nauseousness has somewhat improved since vomiting x1.  Patient states she has not taken anything for her fever.  States her appetite is decreased, but has been trying to keep her self hydrated.  Patient Active Problem List   Diagnosis Date Noted  . HLD (hyperlipidemia) 01/26/2016  . Discoloration of tongue 12/21/2015  . Elevated blood pressure reading 11/16/2015  . Rash and nonspecific skin eruption 02/09/2015  . Cat scratch 02/04/2015  . Routine general medical examination at a health care facility 10/28/2014  . Obesity (BMI 30-39.9) 03/29/2014  . Arthritis of right hip 11/09/2013  . Medicare annual wellness visit, initial 08/27/2013  . GAD (generalized anxiety disorder) 08/27/2013  . Osteopenia 07/27/2013   Social History   Tobacco Use  . Smoking status: Never Smoker  . Smokeless tobacco: Never Used  Substance Use Topics  . Alcohol use: No   Review of Systems  Constitutional: +chills, fatigue and fever.  HENT: Negative for congestion, ear pain, sinus pain and sore throat.   Eyes: Negative.   Respiratory: Negative for shortness of breath and wheezing. +cough Cardiovascular: Negative for chest pain, palpitations and leg swelling.  Gastrointestinal: Negative for abdominal pain, diarrhea.  +nausea and vomiting x1 episode.  Genitourinary: Negative for dysuria, frequency and urgency.  Musculoskeletal: Negative for arthralgias and myalgias.  Skin: Negative for color change, pallor  and rash.  Neurological: Negative for syncope, light-headedness and headaches.  Psychiatric/Behavioral: The patient is not nervous/anxious.       Objective:   Physical Exam Vitals signs and nursing note reviewed.  Constitutional:      General: She is not in acute distress.    Appearance: She is ill-appearing (appears tired). She is not toxic-appearing.     Comments: +febrile  HENT:     Head: Normocephalic and atraumatic.     Nose: Congestion and rhinorrhea present.     Mouth/Throat:     Mouth: Mucous membranes are moist.     Pharynx: Posterior oropharyngeal erythema (post nasal drip) present. No oropharyngeal exudate.  Eyes:     General: No scleral icterus.       Right eye: No discharge.        Left eye: No discharge.     Extraocular Movements: Extraocular movements intact.     Conjunctiva/sclera: Conjunctivae normal.  Neck:     Musculoskeletal: Neck supple. No neck rigidity.  Cardiovascular:     Rate and Rhythm: Tachycardia present.  Pulmonary:     Effort: Pulmonary effort is normal. No respiratory distress.     Breath sounds: Normal breath sounds. No wheezing, rhonchi or rales.  Abdominal:     General: Bowel sounds are normal. There is no distension.     Palpations: Abdomen is soft.     Tenderness: There is no abdominal tenderness.  Lymphadenopathy:     Cervical: No cervical adenopathy.  Skin:    General: Skin is warm and dry.  Neurological:  Mental Status: She is alert and oriented to person, place, and time.  Psychiatric:        Mood and Affect: Mood normal.        Behavior: Behavior normal.     Vitals:   05/13/18 1421  BP: 90/60  Pulse: (!) 108  Resp: 20  Temp: (!) 100.9 F (38.3 C)  SpO2: 94%       Assessment & Plan:   Systemic viral illness, fever chills, nausea vomiting, cough - patient's point-of-care flu swab is negative in clinic.  However symptoms do appear consistent with a flulike illness.  Advised to alternate Tylenol and Motrin to reduce  fever symptoms.  She will use Zofran as needed to help with nauseousness, advised to keep up good fluid intake and eat a bland diet for the next 1 to 2 days to allow some gut rest.  Cough has improved on its own, advised she may continue to use the over-the-counter cough medication she has been using if needed.  Patient advised that viral illnesses usually improve over the course of 3 to 5 days, with full resolution between 7 to 10 days.  Advised patient that if fever is not improved with Tylenol and Motrin, symptoms worsen she needs to call office right away and/or return to clinic/ER for evaluation.  Patient will keep regularly scheduled follow-up with PCP as planned.  Aware she can return to clinic sooner if new issues arise or if current symptoms persist or worsen.

## 2018-05-16 ENCOUNTER — Emergency Department: Payer: Medicare Other

## 2018-05-16 ENCOUNTER — Inpatient Hospital Stay
Admission: EM | Admit: 2018-05-16 | Discharge: 2018-05-18 | DRG: 683 | Disposition: A | Discharge status: Home / Self Care | Payer: Medicare Other | Attending: Internal Medicine | Admitting: Internal Medicine

## 2018-05-16 ENCOUNTER — Other Ambulatory Visit: Payer: Self-pay

## 2018-05-16 ENCOUNTER — Encounter: Payer: Self-pay | Admitting: Intensive Care

## 2018-05-16 DIAGNOSIS — N179 Acute kidney failure, unspecified: Principal | ICD-10-CM | POA: Diagnosis present

## 2018-05-16 DIAGNOSIS — Z825 Family history of asthma and other chronic lower respiratory diseases: Secondary | ICD-10-CM

## 2018-05-16 DIAGNOSIS — Z8249 Family history of ischemic heart disease and other diseases of the circulatory system: Secondary | ICD-10-CM

## 2018-05-16 DIAGNOSIS — E871 Hypo-osmolality and hyponatremia: Secondary | ICD-10-CM | POA: Diagnosis present

## 2018-05-16 DIAGNOSIS — E860 Dehydration: Secondary | ICD-10-CM | POA: Diagnosis present

## 2018-05-16 DIAGNOSIS — Z8349 Family history of other endocrine, nutritional and metabolic diseases: Secondary | ICD-10-CM

## 2018-05-16 DIAGNOSIS — G40909 Epilepsy, unspecified, not intractable, without status epilepticus: Secondary | ICD-10-CM | POA: Diagnosis present

## 2018-05-16 DIAGNOSIS — J111 Influenza due to unidentified influenza virus with other respiratory manifestations: Secondary | ICD-10-CM | POA: Diagnosis present

## 2018-05-16 DIAGNOSIS — Z79899 Other long term (current) drug therapy: Secondary | ICD-10-CM | POA: Diagnosis not present

## 2018-05-16 DIAGNOSIS — Z818 Family history of other mental and behavioral disorders: Secondary | ICD-10-CM | POA: Diagnosis not present

## 2018-05-16 DIAGNOSIS — M1711 Unilateral primary osteoarthritis, right knee: Secondary | ICD-10-CM | POA: Diagnosis present

## 2018-05-16 DIAGNOSIS — Z792 Long term (current) use of antibiotics: Secondary | ICD-10-CM

## 2018-05-16 DIAGNOSIS — N6019 Diffuse cystic mastopathy of unspecified breast: Secondary | ICD-10-CM | POA: Diagnosis present

## 2018-05-16 DIAGNOSIS — N390 Urinary tract infection, site not specified: Secondary | ICD-10-CM | POA: Diagnosis present

## 2018-05-16 LAB — CBC
HCT: 37.3 % (ref 36.0–46.0)
Hemoglobin: 12.5 g/dL (ref 12.0–15.0)
MCH: 29.3 pg (ref 26.0–34.0)
MCHC: 33.5 g/dL (ref 30.0–36.0)
MCV: 87.6 fL (ref 80.0–100.0)
PLATELETS: 245 10*3/uL (ref 150–400)
RBC: 4.26 MIL/uL (ref 3.87–5.11)
RDW: 12.3 % (ref 11.5–15.5)
WBC: 14.4 10*3/uL — ABNORMAL HIGH (ref 4.0–10.5)
nRBC: 0 % (ref 0.0–0.2)

## 2018-05-16 LAB — BASIC METABOLIC PANEL
ANION GAP: 13 (ref 5–15)
Anion gap: 11 (ref 5–15)
BUN: 33 mg/dL — ABNORMAL HIGH (ref 8–23)
BUN: 32 mg/dL — ABNORMAL HIGH (ref 8–23)
CO2: 21 mmol/L — ABNORMAL LOW (ref 22–32)
CO2: 20 mmol/L — ABNORMAL LOW (ref 22–32)
CREATININE: 2.31 mg/dL — AB (ref 0.44–1.00)
Calcium: 9 mg/dL (ref 8.9–10.3)
Calcium: 8.2 mg/dL — ABNORMAL LOW (ref 8.9–10.3)
Chloride: 96 mmol/L — ABNORMAL LOW (ref 98–111)
Chloride: 102 mmol/L (ref 98–111)
Creatinine, Ser: 2.25 mg/dL — ABNORMAL HIGH (ref 0.44–1.00)
GFR calc Af Amer: 24 mL/min — ABNORMAL LOW (ref >60)
GFR calc non Af Amer: 22 mL/min — ABNORMAL LOW (ref >60)
GFR calc non Af Amer: 21 mL/min — ABNORMAL LOW (ref >60)
GFR, EST AFRICAN AMERICAN: 25 mL/min — AB (ref >60)
GLUCOSE: 128 mg/dL — AB (ref 70–99)
Glucose, Bld: 120 mg/dL — ABNORMAL HIGH (ref 70–99)
Potassium: 3.6 mmol/L (ref 3.5–5.1)
Potassium: 3.6 mmol/L (ref 3.5–5.1)
Sodium: 130 mmol/L — ABNORMAL LOW (ref 135–145)
Sodium: 133 mmol/L — ABNORMAL LOW (ref 135–145)

## 2018-05-16 LAB — LIPASE, BLOOD: Lipase: 22 U/L (ref 11–51)

## 2018-05-16 LAB — URINALYSIS, COMPLETE (UACMP) WITH MICROSCOPIC
BILIRUBIN URINE: NEGATIVE
Glucose, UA: NEGATIVE mg/dL
Ketones, ur: NEGATIVE mg/dL
Nitrite: NEGATIVE
Protein, ur: 30 mg/dL — AB
Specific Gravity, Urine: 1.003 — ABNORMAL LOW (ref 1.005–1.030)
Squamous Epithelial / HPF: NONE SEEN (ref 0–5)
pH: 6 (ref 5.0–8.0)

## 2018-05-16 LAB — TROPONIN I: Troponin I: <0.03 ng/mL (ref <0.03)

## 2018-05-16 LAB — BRAIN NATRIURETIC PEPTIDE: B Natriuretic Peptide: 207 pg/mL — ABNORMAL HIGH (ref 0.0–100.0)

## 2018-05-16 LAB — LACTIC ACID, PLASMA: Lactic Acid, Venous: 1.1 mmol/L (ref 0.5–1.9)

## 2018-05-16 LAB — INFLUENZA PANEL BY PCR (TYPE A & B)
Influenza A By PCR: NEGATIVE
Influenza B By PCR: NEGATIVE

## 2018-05-16 MED ORDER — SODIUM CHLORIDE 0.9 % IV SOLN
INTRAVENOUS | Status: DC
  Administered 2018-05-16 – 2018-05-18 (×4): via INTRAVENOUS

## 2018-05-16 MED ORDER — BISACODYL 5 MG PO TBEC: 5.0000 mg | DELAYED_RELEASE_TABLET | Freq: Every day | ORAL | Status: DC | PRN

## 2018-05-16 MED ORDER — ALBUTEROL SULFATE (2.5 MG/3ML) 0.083% IN NEBU
5.0000 mg | INHALATION_SOLUTION | Freq: Once | RESPIRATORY_TRACT | Status: AC
  Administered 2018-05-16: 5 mg via RESPIRATORY_TRACT
  Filled 2018-05-16: qty 6

## 2018-05-16 MED ORDER — ALBUTEROL SULFATE (2.5 MG/3ML) 0.083% IN NEBU: 2.5000 mg | INHALATION_SOLUTION | RESPIRATORY_TRACT | Status: DC | PRN

## 2018-05-16 MED ORDER — ALBUTEROL SULFATE (2.5 MG/3ML) 0.083% IN NEBU
INHALATION_SOLUTION | RESPIRATORY_TRACT | Status: AC
  Filled 2018-05-16: qty 3

## 2018-05-16 MED ORDER — MINOXIDIL 2.5 MG PO TABS
1.2500 mg | ORAL_TABLET | Freq: Every day | ORAL | Status: DC
  Administered 2018-05-17 – 2018-05-18 (×2): 1.25 mg via ORAL
  Filled 2018-05-16 (×4): qty 0.5

## 2018-05-16 MED ORDER — HYDROCODONE-ACETAMINOPHEN 5-325 MG PO TABS: 1.0000 | ORAL_TABLET | ORAL | Status: DC | PRN

## 2018-05-16 MED ORDER — LEVETIRACETAM 500 MG PO TABS
500.0000 mg | ORAL_TABLET | Freq: Two times a day (BID) | ORAL | Status: DC
  Administered 2018-05-16 – 2018-05-18 (×5): 500 mg via ORAL
  Filled 2018-05-16 (×5): qty 1

## 2018-05-16 MED ORDER — MELATONIN 5 MG PO TABS
1.0000 | ORAL_TABLET | ORAL | Status: DC | PRN
  Filled 2018-05-16: qty 1

## 2018-05-16 MED ORDER — ACETAMINOPHEN 650 MG RE SUPP: 650.0000 mg | Freq: Four times a day (QID) | RECTAL | Status: DC | PRN

## 2018-05-16 MED ORDER — HEPARIN SODIUM (PORCINE) 5000 UNIT/ML IJ SOLN
5000.0000 [IU] | Freq: Three times a day (TID) | INTRAMUSCULAR | Status: DC
  Administered 2018-05-16 – 2018-05-18 (×5): 5000 [IU] via SUBCUTANEOUS
  Filled 2018-05-16 (×5): qty 1

## 2018-05-16 MED ORDER — SODIUM CHLORIDE 0.9 % IV BOLUS
1000.0000 mL | Freq: Once | INTRAVENOUS | Status: AC
  Administered 2018-05-16: 1000 mL via INTRAVENOUS

## 2018-05-16 MED ORDER — RISAQUAD PO CAPS
ORAL_CAPSULE | Freq: Every day | ORAL | Status: DC
  Administered 2018-05-16 – 2018-05-18 (×3): 1 via ORAL
  Filled 2018-05-16 (×3): qty 1

## 2018-05-16 MED ORDER — ACETAMINOPHEN 325 MG PO TABS: 650.0000 mg | ORAL_TABLET | Freq: Four times a day (QID) | ORAL | Status: DC | PRN

## 2018-05-16 MED ORDER — SENNOSIDES-DOCUSATE SODIUM 8.6-50 MG PO TABS: 1.0000 | ORAL_TABLET | Freq: Every evening | ORAL | Status: DC | PRN

## 2018-05-16 MED ORDER — ONDANSETRON HCL 4 MG/2ML IJ SOLN: 4.0000 mg | Freq: Four times a day (QID) | INTRAMUSCULAR | Status: DC | PRN

## 2018-05-16 MED ORDER — SODIUM CHLORIDE 0.9 % IV BOLUS
500.0000 mL | Freq: Once | INTRAVENOUS | Status: AC
  Administered 2018-05-16: 500 mL via INTRAVENOUS

## 2018-05-16 MED ORDER — VITAMIN B-12 1000 MCG PO TABS
1000.0000 ug | ORAL_TABLET | Freq: Every day | ORAL | Status: DC
  Administered 2018-05-16 – 2018-05-18 (×3): 1000 ug via ORAL
  Filled 2018-05-16 (×3): qty 1

## 2018-05-16 MED ORDER — PRAVASTATIN SODIUM 20 MG PO TABS
20.0000 mg | ORAL_TABLET | Freq: Every day | ORAL | Status: DC
  Administered 2018-05-16 – 2018-05-18 (×3): 20 mg via ORAL
  Filled 2018-05-16 (×3): qty 1

## 2018-05-16 MED ORDER — ONDANSETRON HCL 4 MG PO TABS: 4.0000 mg | ORAL_TABLET | Freq: Four times a day (QID) | ORAL | Status: DC | PRN

## 2018-05-16 MED ORDER — CALCIUM CARBONATE-VITAMIN D 500-200 MG-UNIT PO TABS
1.0000 | ORAL_TABLET | Freq: Every day | ORAL | Status: DC
  Administered 2018-05-16 – 2018-05-18 (×3): 1 via ORAL
  Filled 2018-05-16 (×3): qty 1

## 2018-05-16 MED ORDER — GUAIFENESIN-DM 100-10 MG/5ML PO SYRP
5.0000 mL | ORAL_SOLUTION | ORAL | Status: DC | PRN
  Administered 2018-05-16 – 2018-05-18 (×4): 5 mL via ORAL
  Filled 2018-05-16 (×5): qty 5

## 2018-05-16 NOTE — ED Notes (Signed)
Pt being transported to the [facility] by Selena Batten, Charity fundraiser

## 2018-05-16 NOTE — ED Notes (Signed)
ED TO INPATIENT HANDOFF REPORT  ED Nurse Name and Phone #:  Seward Speck [phone]  S Name/Age/Gender [name] 68 y.o. female Room/Bed: ED37A/ED37A  Code Status   Code Status: Full Code  Home/SNF/Other Home Patient oriented to: self, place, time and situation Is this baseline? Yes   Triage Complete: Triage complete  Chief Complaint Flu Sx   Triage Note Saw PCP Tuesday and was told she had viral symptoms and no Flu. Patient c/o N/V/D, weakness, and numb bilateral feet. A&O x4    Allergies Allergies  Allergen Reactions  . Compazine [Prochlorperazine Edisylate] Other (See Comments)    Neck drew back per patient report. Muscle Tightness/spasms    Level of Care/Admitting Diagnosis ED Disposition    ED Disposition Condition Comment   Admit  Hospital Area: [facility] [[id number]]  Level of Care: Med-Surg [16]  Diagnosis: ARF (acute renal failure) [facility]) [[id number]]  Admitting Physician: Shaune Pollack [[id number]]  Attending Physician: Shaune Pollack [[id number]]  Estimated length of stay: past midnight tomorrow  Certification:: I certify this patient will need inpatient services for at least 2 midnights  PT Class (Do Not Modify): Inpatient [101]  PT Acc Code (Do Not Modify): Private [1]       B Medical/Surgery History Past Medical History:  Diagnosis Date  . Arthritis   . Arthritis of right knee   . Bilateral ankle fractures   . Fibrocystic breast disease   . History of chicken pox   . Seizures (HCC)    Past Surgical History:  Procedure Laterality Date  . DILATION AND CURETTAGE OF UTERUS    . VAGINAL DELIVERY       A IV Location/Drains/Wounds Patient Lines/Drains/Airways Status   Active Line/Drains/Airways    Name:   Placement date:   Placement time:   Site:   Days:   Peripheral IV 05/16/18 Right Antecubital   05/16/18    1229    Antecubital   less than 1          Intake/Output Last 24 hours No intake or output data in the 24 hours ending  05/16/18 1632  Labs/Imaging Results for orders placed or performed during the hospital encounter of 05/16/18 (from the past 48 hour(s))  Basic metabolic panel     Status: Abnormal   Collection Time: 05/16/18  8:15 AM  Result Value Ref Range   Sodium 130 (L) 135 - 145 mmol/L   Potassium 3.6 3.5 - 5.1 mmol/L   Chloride 96 (L) 98 - 111 mmol/L   CO2 21 (L) 22 - 32 mmol/L   Glucose, Bld 120 (H) 70 - 99 mg/dL   BUN 33 (H) 8 - 23 mg/dL   Creatinine, Ser 1.30 (H) 0.44 - 1.00 mg/dL   Calcium 9.0 8.9 - 86.5 mg/dL   GFR calc non Af Amer 22 (L) >60 mL/min   GFR calc Af Amer 25 (L) >60 mL/min   Anion gap 13 5 - 15    Comment: Performed at [facility], [address] [place], [place] [address]  CBC     Status: Abnormal   Collection Time: 05/16/18  8:15 AM  Result Value Ref Range   WBC 14.4 (H) 4.0 - 10.5 K/uL   RBC 4.26 3.87 - 5.11 MIL/uL   Hemoglobin 12.5 12.0 - 15.0 g/dL   HCT 62.9 52.8 - 41.3 %   MCV 87.6 80.0 - 100.0 fL   MCH 29.3 26.0 - 34.0 pg   MCHC 33.5 30.0 - 36.0  g/dL   RDW 16.112.3 09.611.5 - 04.515.5 %   Platelets 245 150 - 400 K/uL   nRBC 0.0 0.0 - 0.2 %    Comment: Performed at [facility], [address] [place], [place] [address]  Troponin I - ONCE - STAT     Status: None   Collection Time: 05/16/18  8:15 AM  Result Value Ref Range   Troponin I <0.03 <0.03 ng/mL    Comment: Performed at [facility], [address] [place], [place] [address]  Lipase, blood     Status: None   Collection Time: 05/16/18  8:15 AM  Result Value Ref Range   Lipase 22 11 - 51 U/L    Comment: Performed at [facility], [address] [place], [place] [address]  Brain natriuretic peptide     Status: Abnormal   Collection Time: 05/16/18  8:15 AM  Result Value Ref Range   B Natriuretic Peptide 207.0 (H) 0.0 - 100.0 pg/mL    Comment: Performed at [facility], [address] [place], [place] [address]  Influenza panel by PCR (type A & B)     Status:  None   Collection Time: 05/16/18 12:37 PM  Result Value Ref Range   Influenza A By PCR NEGATIVE NEGATIVE   Influenza B By PCR NEGATIVE NEGATIVE    Comment: (NOTE) The Xpert Xpress Flu assay is intended as an aid in the diagnosis of  influenza and should not be used as a sole basis for treatment.  This  assay is FDA approved for nasopharyngeal swab specimens only. Nasal  washings and aspirates are unacceptable for Xpert Xpress Flu testing. Performed at [facility], [address] [place], [place] [address]   Lactic acid, plasma     Status: None   Collection Time: 05/16/18 12:37 PM  Result Value Ref Range   Lactic Acid, Venous 1.1 0.5 - 1.9 mmol/L    Comment: Performed at [facility], [address] [place], [place] [address]  Urinalysis, Complete w Microscopic     Status: Abnormal   Collection Time: 05/16/18  1:25 PM  Result Value Ref Range   Color, Urine STRAW (A) YELLOW   APPearance CLEAR (A) CLEAR   Specific Gravity, Urine 1.003 (L) 1.005 - 1.030   pH 6.0 5.0 - 8.0   Glucose, UA NEGATIVE NEGATIVE mg/dL   Hgb urine dipstick SMALL (A) NEGATIVE   Bilirubin Urine NEGATIVE NEGATIVE   Ketones, ur NEGATIVE NEGATIVE mg/dL   Protein, ur 30 (A) NEGATIVE mg/dL   Nitrite NEGATIVE NEGATIVE   Leukocytes,Ua SMALL (A) NEGATIVE   RBC / HPF 0-5 0 - 5 RBC/hpf   WBC, UA 6-10 0 - 5 WBC/hpf   Bacteria, UA MANY (A) NONE SEEN   Squamous Epithelial / LPF NONE SEEN 0 - 5   Mucus PRESENT     Comment: Performed at [facility], [address] [place], [place] [address]  Basic metabolic panel     Status: Abnormal   Collection Time: 05/16/18  2:30 PM  Result Value Ref Range   Sodium 133 (L) 135 - 145 mmol/L   Potassium 3.6 3.5 - 5.1 mmol/L   Chloride 102 98 - 111 mmol/L   CO2 20 (L) 22 - 32 mmol/L   Glucose, Bld 128 (H) 70 - 99 mg/dL   BUN 32 (H) 8 - 23 mg/dL   Creatinine, Ser 3.242.31 (H) 0.44 - 1.00 mg/dL   Calcium 8.2 (L) 8.9 -  10.3 mg/dL   GFR calc non Af Amer  21 (L) >60 mL/min   GFR calc Af Amer 24 (L) >60 mL/min   Anion gap 11 5 - 15    Comment: Performed at [facility], [address] [place], [place] [address]   Dg Chest 2 View  Result Date: 05/16/2018 CLINICAL DATA:  Cough and cold symptoms for 2 weeks EXAM: CHEST - 2 VIEW COMPARISON:  None. FINDINGS: The heart size and mediastinal contours are within normal limits. Both lungs are clear. The visualized skeletal structures are unremarkable. IMPRESSION: No active cardiopulmonary disease. Electronically Signed   By: Alcide Clever M.D.   On: 05/16/2018 09:06    Pending Labs Unresulted Labs (From admission, onward)    Start     Ordered   05/17/18 0500  Basic metabolic panel  Tomorrow morning,   STAT     05/16/18 1539   05/17/18 0500  CBC  Tomorrow morning,   STAT     05/16/18 1539   05/16/18 1540  Creatinine, serum  (heparin)  Once,   STAT    Comments:  Baseline for heparin therapy IF NOT ALREADY DRAWN.    05/16/18 1539   05/16/18 1540  HIV antibody (Routine Testing)  Once,   STAT     05/16/18 1539   05/16/18 1540  Respiratory Panel by PCR  (Respiratory virus panel with precautions)  Once,   STAT     05/16/18 1539   05/16/18 1429  Urine culture  Add-on,   AD     05/16/18 1428          Vitals/Pain Today's Vitals   05/16/18 1400 05/16/18 1430 05/16/18 1500 05/16/18 1545  BP: (!) 175/76 (!) 143/68 (!) 141/63 131/69  Pulse: (!) 113 (!) 110 (!) 110 (!) 108  Resp:    18  Temp:      TempSrc:      SpO2: 98% 97% 95% 99%  Weight:      Height:      PainSc:    0-No pain    Isolation Precautions Droplet precaution  Medications Medications  heparin injection 5,000 Units (5,000 Units Subcutaneous Given 05/16/18 1625)  acetaminophen (TYLENOL) tablet 650 mg (has no administration in time range)    Or  acetaminophen (TYLENOL) suppository 650 mg (has no administration in time range)  ondansetron (ZOFRAN) tablet 4 mg (has no administration in time range)    Or  ondansetron  (ZOFRAN) injection 4 mg (has no administration in time range)  albuterol (PROVENTIL) (2.5 MG/3ML) 0.083% nebulizer solution 2.5 mg (has no administration in time range)  0.9 %  sodium chloride infusion ( Intravenous New Bag/Given 05/16/18 1551)  HYDROcodone-acetaminophen (NORCO/VICODIN) 5-325 MG per tablet 1-2 tablet (has no administration in time range)  senna-docusate (Senokot-S) tablet 1 tablet (has no administration in time range)  bisacodyl (DULCOLAX) EC tablet 5 mg (has no administration in time range)  guaiFENesin-dextromethorphan (ROBITUSSIN DM) 100-10 MG/5ML syrup 5 mL (5 mLs Oral Given 05/16/18 1629)  minoxidil (LONITEN) tablet 2.5 mg (has no administration in time range)  pravastatin (PRAVACHOL) tablet 20 mg (20 mg Oral Given 05/16/18 1627)  acidophilus (RISAQUAD) capsule (1 capsule Oral Given 05/16/18 1628)  vitamin B-12 (CYANOCOBALAMIN) tablet 1,000 mcg (1,000 mcg Oral Given 05/16/18 1627)  Melatonin TABS 5 mg (has no administration in time range)  levETIRAcetam (KEPPRA) tablet 500 mg (500 mg Oral Given 05/16/18 1625)  calcium-vitamin D (OSCAL WITH D) 500-200 MG-UNIT per tablet 1 tablet (1 tablet Oral  Given 05/16/18 1630)  sodium chloride 0.9 % bolus 1,000 mL (0 mLs Intravenous Stopped 05/16/18 1428)  albuterol (PROVENTIL) (2.5 MG/3ML) 0.083% nebulizer solution 5 mg (5 mg Nebulization Given 05/16/18 1234)  sodium chloride 0.9 % bolus 500 mL (0 mLs Intravenous Stopped 05/16/18 1428)    Mobility walks Low fall risk   Focused Assessments Cardiac Assessment Handoff:    Lab Results  Component Value Date   TROPONINI <0.03 05/16/2018   No results found for: DDIMER Does the Patient currently have chest pain? No     Pulmonary Assessment Handoff:  Lung sounds:  Clear O2 Device: Room Air        R Recommendations: See Admitting Provider Note  Report given to:   Additional Notes:

## 2018-05-16 NOTE — H&P (Addendum)
[facility] [place] [facility]   PATIENT NAME: Samantha Ellison    MR#:  [id number]  DATE OF BIRTH:  1951-02-25  DATE OF ADMISSION:  05/16/2018  PRIMARY CARE PHYSICIAN: Allegra Grana, FNP   REQUESTING/REFERRING PHYSICIAN: Dr. Alphonzo Lemmings  CHIEF COMPLAINT:  No chief complaint on file.  Generalized weakness, cough and congestion for 3 weeks. HISTORY OF PRESENT ILLNESS:  Samantha Ellison  is a 68 y.o. female with a known history of arthritis, seizure disorder, fibrocystic breast disease.  The patient presents the ED with above chief complaints.  The patient got shingle vaccine 3 weeks ago.  She developed fever and muscle aching.  She has had poor oral intake and generalized weakness since then.  According to her husband, bolus the patient and her husband got flu.  But the influenza test here is negative.  The patient also complains of intermittent nausea, vomiting and mild diarrhea.  Chest x-ray is unremarkable.  But she is found acute renal failure due to dehydration.  She is treated with IV fluid without improvement in the ED.  Dr. Alphonzo Lemmings requested admission. PAST MEDICAL HISTORY:   Past Medical History:  Diagnosis Date  . Arthritis   . Arthritis of right knee   . Bilateral ankle fractures   . Fibrocystic breast disease   . History of chicken pox   . Seizures (HCC)     PAST SURGICAL HISTORY:   Past Surgical History:  Procedure Laterality Date  . DILATION AND CURETTAGE OF UTERUS    . VAGINAL DELIVERY      SOCIAL HISTORY:   Social History   Tobacco Use  . Smoking status: Never Smoker  . Smokeless tobacco: Never Used  Substance Use Topics  . Alcohol use: No    FAMILY HISTORY:   Family History  Problem Relation Age of Onset  . Hyperlipidemia Mother   . Hypertension Mother   . COPD Mother   . Aneurysm Mother   . Hypertension Father   . COPD Father   . Depression Daughter   . Heart disease Brother        CAD s/p stents    DRUG ALLERGIES:     Allergies  Allergen Reactions  . Compazine [Prochlorperazine Edisylate] Other (See Comments)    Neck drew back per patient report. Muscle Tightness/spasms    REVIEW OF SYSTEMS:   Review of Systems  Constitutional: Positive for malaise/fatigue. Negative for chills and fever.  HENT: Positive for hearing loss. Negative for sore throat.   Eyes: Negative for blurred vision and double vision.  Respiratory: Positive for cough and sputum production. Negative for hemoptysis, shortness of breath, wheezing and stridor.   Cardiovascular: Negative for chest pain, palpitations, orthopnea and leg swelling.  Gastrointestinal: Positive for diarrhea, nausea and vomiting. Negative for abdominal pain, blood in stool and melena.  Genitourinary: Negative for dysuria, flank pain and hematuria.  Musculoskeletal: Negative for back pain and joint pain.  Skin: Negative for rash.  Neurological: Negative for dizziness, sensory change, focal weakness, seizures, loss of consciousness, weakness and headaches.  Endo/Heme/Allergies: Negative for polydipsia.  Psychiatric/Behavioral: Negative for depression. The patient is not nervous/anxious.     MEDICATIONS AT HOME:   Prior to Admission medications   Medication Sig Start Date End Date Taking? Authorizing Provider  Calcium 600-200 MG-UNIT tablet Take 1 tablet by mouth daily.    [provider]  Cholecalciferol (VITAMIN D-1000 MAX ST) 1000 units tablet Take by mouth.    [provider]  levETIRAcetam (KEPPRA) 500 MG tablet Take 1 tablet (500 mg total) by mouth 2 (two) times daily. 02/27/16   Minna Antis, MD  Melatonin 3 MG TABS Take 1 tablet by mouth as needed.    [provider]  minoxidil (LONITEN) 2.5 MG tablet Take 2.5 mg by mouth daily. Take 1/2 tablet daily    [provider]  omega-3 acid ethyl esters (LOVAZA) 1 g capsule Take 2 capsules (2 g total) by mouth 2 (two) times daily. 06/21/17   Allegra Grana, FNP   ondansetron (ZOFRAN ODT) 4 MG disintegrating tablet Take 1 tablet (4 mg total) by mouth every 8 (eight) hours as needed for nausea or vomiting. 05/13/18   Guse, Janna Arch, FNP  pravastatin (PRAVACHOL) 20 MG tablet TAKE 1 TABLET BY MOUTH EVERY DAY 04/02/18   Allegra Grana, FNP  Probiotic Product (PROBIOTIC-10) CAPS Take by mouth daily.    [provider]  vitamin B-12 (CYANOCOBALAMIN) 1000 MCG tablet Take by mouth.    [provider]      VITAL SIGNS:  Blood pressure (!) 141/63, pulse (!) 110, temperature 99.1 F (37.3 C), temperature source Oral, resp. rate 16, height 5\' 4"  (1.626 m), weight 81.6 kg, SpO2 95 %.  PHYSICAL EXAMINATION:  Physical Exam  GENERAL:  68 y.o.-year-old patient lying in the bed with no acute distress.  EYES: Pupils equal, round, reactive to light and accommodation. No scleral icterus. Extraocular muscles intact.  HEENT: Head atraumatic, normocephalic. Oropharynx and nasopharynx clear.  NECK:  Supple, no jugular venous distention. No thyroid enlargement, no tenderness.  LUNGS: Normal breath sounds bilaterally, no wheezing, left basilar crackles.  No use of accessory muscles of respiration.  CARDIOVASCULAR: S1, S2 normal. No murmurs, rubs, or gallops.  ABDOMEN: Soft, nontender, nondistended. Bowel sounds present. No organomegaly or mass.  EXTREMITIES: No pedal edema, cyanosis, or clubbing.  NEUROLOGIC: Cranial nerves II through XII are intact. Muscle strength 5/5 in all extremities. Sensation intact. Gait not checked.  PSYCHIATRIC: The patient is alert and oriented x 3.  SKIN: No obvious rash, lesion, or ulcer.   LABORATORY PANEL:   CBC Recent Labs  Lab 05/16/18 0815  WBC 14.4*  HGB 12.5  HCT 37.3  PLT 245   ------------------------------------------------------------------------------------------------------------------  Chemistries  Recent Labs  Lab 05/16/18 1430  NA 133*  K 3.6  CL 102  CO2 20*  GLUCOSE 128*  BUN 32*   CREATININE 2.31*  CALCIUM 8.2*   ------------------------------------------------------------------------------------------------------------------  Cardiac Enzymes Recent Labs  Lab 05/16/18 0815  TROPONINI <0.03   ------------------------------------------------------------------------------------------------------------------  RADIOLOGY:  Dg Chest 2 View  Result Date: 05/16/2018 CLINICAL DATA:  Cough and cold symptoms for 2 weeks EXAM: CHEST - 2 VIEW COMPARISON:  None. FINDINGS: The heart size and mediastinal contours are within normal limits. Both lungs are clear. The visualized skeletal structures are unremarkable. IMPRESSION: No active cardiopulmonary disease. Electronically Signed   By: Alcide Clever M.D.   On: 05/16/2018 09:06      IMPRESSION AND PLAN:   Acute renal failure due to dehydration. The patient will be admitted to medical floor. Start normal saline IV, follow-up BMP.  Encourage oral intake.  Hyponatremia.  Due to dehydration.  IV fluid support. URI.  Supportive care and Robitussin as needed.  Respiratory panel. Leukocytosis, possible due to reaction.  Follow-up CBC.  All the records are reviewed and case discussed with ED provider. Management plans discussed with the patient, her husband and they are in agreement.  CODE STATUS: Full code.  TOTAL TIME TAKING CARE OF THIS PATIENT: 35 minutes.    Shaune Pollack M.D on 05/16/2018 at 3:21 PM  Between 7am to 6pm - Pager - [phone]  After 6pm go to [email or web address] - Social research officer, government  [facility] [facility]  Office  [phone]  CC: Primary care physician; Allegra Grana, FNP   Note: This dictation was prepared with Dragon dictation along with smaller phrase technology. Any transcriptional errors that result from this process are unin

## 2018-05-16 NOTE — ED Provider Notes (Signed)
[facility] Emergency Department Provider Note  ____________________________________________   I have reviewed the triage vital signs and the nursing notes. Where available I have reviewed prior notes and, if possible and indicated, outside hospital notes.    HISTORY  Chief Complaint No chief complaint on file.    HPI Samantha Ellison is a 68 y.o. female who presents today complaining of cough for 3 weeks, cough is actually not getting better or worse she has had fevers Not in the last for 5 days, she did see her primary care doctor they told her that she had a viral syndrome, she has had intermittent nausea and vomiting and diarrhea, she had some diarrhea today.  Nonbloody .  No melena.  Also did some vomiting last night.  This is been going ongoing for the last week or so as well.  She denies any focal abdominal pain, she denies any chest pain shortness of breath aside from her cough.  She does have URI symptoms, fortunately her fever has resolved.  Past Medical History:  Diagnosis Date  . Arthritis   . Arthritis of right knee   . Bilateral ankle fractures   . Fibrocystic breast disease   . History of chicken pox   . Seizures [facility])     Patient Active Problem List   Diagnosis Date Noted  . HLD (hyperlipidemia) 01/26/2016  . Discoloration of tongue 12/21/2015  . Elevated blood pressure reading 11/16/2015  . Rash and nonspecific skin eruption 02/09/2015  . Cat scratch 02/04/2015  . Routine general medical examination at a health care facility 10/28/2014  . Obesity (BMI 30-39.9) 03/29/2014  . Arthritis of right hip 11/09/2013  . Medicare annual wellness visit, initial 08/27/2013  . GAD (generalized anxiety disorder) 08/27/2013  . Osteopenia 07/27/2013    Past Surgical History:  Procedure Laterality Date  . DILATION AND CURETTAGE OF UTERUS    . VAGINAL DELIVERY      Prior to Admission medications   Medication Sig Start Date End Date Taking?  Authorizing Provider  Calcium 600-200 MG-UNIT tablet Take 1 tablet by mouth daily.    [provider]  Cholecalciferol (VITAMIN D-1000 MAX ST) 1000 units tablet Take by mouth.    [provider]  levETIRAcetam (KEPPRA) 500 MG tablet Take 1 tablet (500 mg total) by mouth 2 (two) times daily. 02/27/16   Minna Antis, MD  Melatonin 3 MG TABS Take 1 tablet by mouth as needed.    [provider]  minoxidil (LONITEN) 2.5 MG tablet Take 2.5 mg by mouth daily. Take 1/2 tablet daily    [provider]  omega-3 acid ethyl esters (LOVAZA) 1 g capsule Take 2 capsules (2 g total) by mouth 2 (two) times daily. 06/21/17   Allegra Grana, FNP  ondansetron (ZOFRAN ODT) 4 MG disintegrating tablet Take 1 tablet (4 mg total) by mouth every 8 (eight) hours as needed for nausea or vomiting. 05/13/18   Guse, Janna Arch, FNP  pravastatin (PRAVACHOL) 20 MG tablet TAKE 1 TABLET BY MOUTH EVERY DAY 04/02/18   Allegra Grana, FNP  Probiotic Product (PROBIOTIC-10) CAPS Take by mouth daily.    [provider]  vitamin B-12 (CYANOCOBALAMIN) 1000 MCG tablet Take by mouth.    [provider]    Allergies Compazine [prochlorperazine edisylate]  Family History  Problem Relation Age of Onset  . Hyperlipidemia Mother   . Hypertension Mother   . COPD Mother   . Aneurysm Mother   . Hypertension  Father   . COPD Father   . Depression Daughter   . Heart disease Brother        CAD s/p stents    Social History Social History   Tobacco Use  . Smoking status: Never Smoker  . Smokeless tobacco: Never Used  Substance Use Topics  . Alcohol use: No  . Drug use: No    Review of Systems Constitutional: No fever/chills Eyes: No visual changes. ENT: No sore throat. No stiff neck no neck pain Cardiovascular: Denies chest pain. Respiratory: Denies shortness of breath. + cough Gastrointestinal:  See hpi Genitourinary: Negative for dysuria. Musculoskeletal: Negative  lower extremity swelling Skin: Negative for rash. Neurological: Negative for severe headaches, focal weakness or numbness.   ____________________________________________   PHYSICAL EXAM:  VITAL SIGNS: ED Triage Vitals  Enc Vitals Group     BP 05/16/18 0756 122/82     Pulse Rate 05/16/18 0756 (!) 11     Resp 05/16/18 0756 16     Temp 05/16/18 0756 99.1 F (37.3 C)     Temp Source 05/16/18 0756 Oral     SpO2 05/16/18 0756 94 %     Weight 05/16/18 0757 180 lb (81.6 kg)     Height 05/16/18 0757 5\' 4"  (1.626 m)     Head Circumference --      Peak Flow --      Pain Score 05/16/18 0756 4     Pain Loc --      Pain Edu? --      Excl. in GC? --     Constitutional: Alert and oriented. Well appearing and in no acute distress. Eyes: Conjunctivae are normal Head: Atraumatic HEENT: No congestion/rhinnorhea. Mucous membranes are dry.  Oropharynx non-erythematous Neck:   Nontender with no meningismus, no masses, no stridor Cardiovascular: Normal rate, regular rhythm. Grossly normal heart sounds.  Good peripheral circulation. Respiratory: Normal respiratory effort.  No retractions. Lungs CTAB. Abdominal: Soft and nontender. No distention. No guarding no rebound Back:  There is no focal tenderness or step off.  there is no midline tenderness there are no lesions noted. there is no CVA tenderness Musculoskeletal: No lower extremity tenderness, no upper extremity tenderness. No joint effusions, no DVT signs strong distal pulses no edema Neurologic:  Normal speech and language. No gross focal neurologic deficits are appreciated.  Skin:  Skin is warm, dry and intact. No rash noted. Psychiatric: Mood and affect are normal. Speech and behavior are normal.  ____________________________________________   LABS (all labs ordered are listed, but only abnormal results are displayed)  Labs Reviewed  BASIC METABOLIC PANEL - Abnormal; Notable for the following components:      Result Value   Sodium  130 (*)    Chloride 96 (*)    CO2 21 (*)    Glucose, Bld 120 (*)    BUN 33 (*)    Creatinine, Ser 2.25 (*)    GFR calc non Af Amer 22 (*)    GFR calc Af Amer 25 (*)    All other components within normal limits  CBC - Abnormal; Notable for the following components:   WBC 14.4 (*)    All other components within normal limits  TROPONIN I  INFLUENZA PANEL BY PCR (TYPE A & B)  LIPASE, BLOOD  LACTIC ACID, PLASMA  LACTIC ACID, PLASMA  BRAIN NATRIURETIC PEPTIDE    Pertinent labs  results that were available during my care of the patient were reviewed by me and considered in  my medical decision making (see chart for details). ____________________________________________  EKG  I personally interpreted any EKGs ordered by me or triage Normal sinus rhythm rate 91 beats minute no acute ST elevation depression nonspecific ECG changes no acute ischemia ____________________________________________  RADIOLOGY  Pertinent labs & imaging results that were available during my care of the patient were reviewed by me and considered in my medical decision making (see chart for details). If possible, patient and/or family made aware of any abnormal findings.  Dg Chest 2 View  Result Date: 05/16/2018 CLINICAL DATA:  Cough and cold symptoms for 2 weeks EXAM: CHEST - 2 VIEW COMPARISON:  None. FINDINGS: The heart size and mediastinal contours are within normal limits. Both lungs are clear. The visualized skeletal structures are unremarkable. IMPRESSION: No active cardiopulmonary disease. Electronically Signed   By: Alcide Clever M.D.   On: 05/16/2018 09:06   ____________________________________________    PROCEDURES  Procedure(s) performed: None  Procedures  Critical Care performed: None  ____________________________________________   INITIAL IMPRESSION / ASSESSMENT AND PLAN / ED COURSE  Pertinent labs & imaging results that were available during my care of the patient were reviewed by me and  considered in my medical decision making (see chart for details).  Patient with fever and URI symptoms, the URI symptoms seem to be somewhat static, and her lungs do not sound bad, chest x-ray is clear, low suspicion that this represents occult pneumonia, sats are reassuring.  EKG is reassuring but will send a troponin, I do not think serial enzymes will be indicated.  I have low suspicion this represents acute ischemia.  Nothing at this time to suggest PE, fevers, rhinorrhea, cough, and the height of infectious symptoms with her husband having the exact same symptoms.  She has now had some vomiting diarrhea which apparently is been worse over the last 24 hours.  She has some evidence of AKI, we are going to give her IV fluids, overall she looks quite well.  We will check BNP to see if her cough is related perhaps to heart failure but I have low suspicion.  I do not think she needs a CT scan at this point.  We will see how she feels after hydration.    ____________________________________________   FINAL CLINICAL IMPRESSION(S) / ED DIAGNOSES  Final diagnoses:  None      This chart was dictated using voice recognition software.  Despite best efforts to proofread,  errors can occur which can change meaning.      Jeanmarie Plant, MD 05/16/18 [phone]

## 2018-05-16 NOTE — ED Triage Notes (Addendum)
Saw PCP Tuesday and was told she had viral symptoms and no Flu. Patient c/o N/V/D, weakness, and numb bilateral feet. A&O x4

## 2018-05-16 NOTE — Progress Notes (Signed)
Advanced Care Plan.  Purpose of Encounter: CODE STATUS. Parties in Attendance: The patient, her husband and me. Patient's Decisional Capacity: Yes. Medical Story: Samantha Ellison  is a 68 y.o. female with a known history of arthritis, seizure disorder, fibrocystic breast disease.  She is being admitted for acute renal failure due to dehydration and URI.  I discussed with patient about her current condition, prognosis and CODE STATUS.  The patient wants to be resuscitated and intubated if she has cardiopulmonary arrest. Plan:  Code Status: Full code. Time spent discussing advance care planning: 17 minutes.

## 2018-05-16 NOTE — ED Notes (Signed)
Attempted to call report on pt, RN will call me back

## 2018-05-16 NOTE — ED Notes (Signed)
Patient now reporting her heart was "fluttering through the night" Denies chest pain at this time

## 2018-05-16 NOTE — ED Notes (Signed)
Pt reports that she feels better since the breathing treatment and the fluids.

## 2018-05-17 LAB — RESPIRATORY PANEL BY PCR
Adenovirus: NOT DETECTED
Bordetella pertussis: NOT DETECTED
Chlamydophila pneumoniae: NOT DETECTED
Coronavirus 229E: NOT DETECTED
Coronavirus HKU1: NOT DETECTED
Coronavirus NL63: NOT DETECTED
Coronavirus OC43: NOT DETECTED
Influenza A H1 2009: DETECTED — AB
Influenza B: NOT DETECTED
METAPNEUMOVIRUS-RVPPCR: NOT DETECTED
Mycoplasma pneumoniae: NOT DETECTED
Parainfluenza Virus 1: NOT DETECTED
Parainfluenza Virus 2: NOT DETECTED
Parainfluenza Virus 3: NOT DETECTED
Parainfluenza Virus 4: NOT DETECTED
RHINOVIRUS / ENTEROVIRUS - RVPPCR: NOT DETECTED
Respiratory Syncytial Virus: NOT DETECTED

## 2018-05-17 LAB — CBC
HCT: 30.8 % — ABNORMAL LOW (ref 36.0–46.0)
Hemoglobin: 10.1 g/dL — ABNORMAL LOW (ref 12.0–15.0)
MCH: 29 pg (ref 26.0–34.0)
MCHC: 32.8 g/dL (ref 30.0–36.0)
MCV: 88.5 fL (ref 80.0–100.0)
PLATELETS: 219 10*3/uL (ref 150–400)
RBC: 3.48 MIL/uL — ABNORMAL LOW (ref 3.87–5.11)
RDW: 13 % (ref 11.5–15.5)
WBC: 10.1 10*3/uL (ref 4.0–10.5)
nRBC: 0 % (ref 0.0–0.2)

## 2018-05-17 LAB — BASIC METABOLIC PANEL
Anion gap: 7 (ref 5–15)
BUN: 31 mg/dL — ABNORMAL HIGH (ref 8–23)
CALCIUM: 8.3 mg/dL — AB (ref 8.9–10.3)
CO2: 21 mmol/L — ABNORMAL LOW (ref 22–32)
CREATININE: 2.29 mg/dL — AB (ref 0.44–1.00)
Chloride: 109 mmol/L (ref 98–111)
GFR calc Af Amer: 24 mL/min — ABNORMAL LOW (ref >60)
GFR calc non Af Amer: 21 mL/min — ABNORMAL LOW (ref >60)
Glucose, Bld: 120 mg/dL — ABNORMAL HIGH (ref 70–99)
Potassium: 4.1 mmol/L (ref 3.5–5.1)
Sodium: 137 mmol/L (ref 135–145)

## 2018-05-17 NOTE — Progress Notes (Signed)
   05/17/18 1100  Clinical Encounter Type  Visited With Patient  Visit Type Initial  Referral From Nurse  Spiritual Encounters  Spiritual Needs Brochure   OR received to complete or update an AD. Patient was awake in bed and talking on the phone, but welcomed the brief interruption. She confirmed interest in both her and her husband completing the AD. Chaplain provided education and left document for their review. Will call or page if necessary.

## 2018-05-18 LAB — BASIC METABOLIC PANEL
Anion gap: 8 (ref 5–15)
BUN: 30 mg/dL — ABNORMAL HIGH (ref 8–23)
CO2: 22 mmol/L (ref 22–32)
Calcium: 8.6 mg/dL — ABNORMAL LOW (ref 8.9–10.3)
Chloride: 107 mmol/L (ref 98–111)
Creatinine, Ser: 1.93 mg/dL — ABNORMAL HIGH (ref 0.44–1.00)
GFR calc Af Amer: 30 mL/min — ABNORMAL LOW (ref >60)
GFR calc non Af Amer: 26 mL/min — ABNORMAL LOW (ref >60)
Glucose, Bld: 121 mg/dL — ABNORMAL HIGH (ref 70–99)
Potassium: 4.4 mmol/L (ref 3.5–5.1)
SODIUM: 137 mmol/L (ref 135–145)

## 2018-05-18 LAB — URINE CULTURE: Culture: >=100000 — AB

## 2018-05-18 LAB — HIV ANTIBODY (ROUTINE TESTING W REFLEX): HIV Screen 4th Generation wRfx: NONREACTIVE

## 2018-05-18 MED ORDER — SODIUM CHLORIDE 0.9 % IV SOLN
1.0000 g | INTRAVENOUS | Status: DC
  Administered 2018-05-18: 1 g via INTRAVENOUS
  Filled 2018-05-18: qty 10
  Filled 2018-05-18: qty 1

## 2018-05-18 MED ORDER — CIPROFLOXACIN HCL 250 MG PO TABS: 250.0000 mg | ORAL_TABLET | Freq: Two times a day (BID) | ORAL | 0 refills | Status: DC

## 2018-05-18 MED ORDER — CIPROFLOXACIN HCL 250 MG PO TABS: 250.0000 mg | ORAL_TABLET | Freq: Every day | ORAL | 0 refills | Status: AC

## 2018-05-18 MED ORDER — OSELTAMIVIR PHOSPHATE 30 MG PO CAPS: 30.0000 mg | ORAL_CAPSULE | Freq: Every day | ORAL | 0 refills | Status: AC

## 2018-05-18 MED ORDER — OSELTAMIVIR PHOSPHATE 30 MG PO CAPS
30.0000 mg | ORAL_CAPSULE | Freq: Every day | ORAL | Status: DC
  Administered 2018-05-18: 30 mg via ORAL
  Filled 2018-05-18: qty 1

## 2018-05-18 MED ORDER — CEFUROXIME AXETIL 250 MG PO TABS: 250.0000 mg | ORAL_TABLET | Freq: Two times a day (BID) | ORAL | 0 refills | Status: DC

## 2018-05-18 NOTE — Discharge Summary (Signed)
[facility] - [place] at [facility]   PATIENT NAME: Samantha Ellison    MR#:  [id number]  DATE OF BIRTH:  [birth year]  DATE OF ADMISSION:  05/16/2018 ADMITTING PHYSICIAN: Shaune Pollack, MD  DATE OF DISCHARGE: 05/18/2018   PRIMARY CARE PHYSICIAN: Allegra Grana, FNP    ADMISSION DIAGNOSIS:  AKI (acute kidney injury) (HCC) [N17.9]  DISCHARGE DIAGNOSIS:  Active Problems:   ARF (acute renal failure) (HCC)   Influenza H1   UTi  SECONDARY DIAGNOSIS:   Past Medical History:  Diagnosis Date  . Arthritis   . Arthritis of right knee   . Bilateral ankle fractures   . Fibrocystic breast disease   . History of chicken pox   . Seizures [facility])     HOSPITAL COURSE:   * Acute renal failure due to dehydration. normal saline IV, follow-up BMP. Encourage oral intake. Renal function is improved but still not back to baseline.  I have spoke to patient and her husband both to have a follow-up with primary care physician next week to check the renal function.  * Influenza H1-Tamiflu 30 mg daily according to her renal function.  *UTI-reviewed sensitivity result and give ciprofloxacin on discharge at home. * Hyponatremia. Due to dehydration. IV fluid support. * URI. Supportive care and Robitussin as needed.  * Leukocytosis, possible due to reaction. Follow-up CBC.   DISCHARGE CONDITIONS:   Stable.  CONSULTS OBTAINED:    DRUG ALLERGIES:   Allergies  Allergen Reactions  . Compazine [Prochlorperazine Edisylate] Other (See Comments)    Neck drew back per patient report. Muscle Tightness/spasms    DISCHARGE MEDICATIONS:   Allergies as of 05/18/2018      Reactions   Compazine [prochlorperazine Edisylate] Other (See Comments)   Neck drew back per patient report. Muscle Tightness/spasms      Medication List    TAKE these medications   Calcium 600-200 MG-UNIT tablet Take 1 tablet by mouth daily.   ciprofloxacin 250 MG tablet Commonly known as:   CIPRO Take 1 tablet (250 mg total) by mouth daily with breakfast for 3 days.   levETIRAcetam 500 MG tablet Commonly known as:  KEPPRA Take 1 tablet (500 mg total) by mouth 2 (two) times daily.   Melatonin 3 MG Tabs Take 1 tablet by mouth as needed.   minoxidil 2.5 MG tablet Commonly known as:  LONITEN Take 2.5 mg by mouth daily. Take 1/2 tablet daily   omega-3 acid ethyl esters 1 g capsule Commonly known as:  LOVAZA Take 2 capsules (2 g total) by mouth 2 (two) times daily.   ondansetron 4 MG disintegrating tablet Commonly known as:  ZOFRAN ODT Take 1 tablet (4 mg total) by mouth every 8 (eight) hours as needed for nausea or vomiting.   oseltamivir 30 MG capsule Commonly known as:  TAMIFLU Take 1 capsule (30 mg total) by mouth daily for 4 days.   pravastatin 20 MG tablet Commonly known as:  PRAVACHOL TAKE 1 TABLET BY MOUTH EVERY DAY   PROBIOTIC-10 Caps Take by mouth daily.   vitamin B-12 1000 MCG tablet Commonly known as:  CYANOCOBALAMIN Take 1,000 mcg by mouth daily.   VITAMIN D-1000 MAX ST 25 MCG (1000 UT) tablet Generic drug:  Cholecalciferol Take by mouth.        DISCHARGE INSTRUCTIONS:    Follow with primary care in 1 week to check renal function.  If you experience worsening of your admission symptoms, develop shortness of breath, life threatening emergency, suicidal  or homicidal thoughts you must seek medical attention immediately by calling 911 or calling your MD immediately  if symptoms less severe.  You Must read complete instructions/literature along with all the possible adverse reactions/side effects for all the Medicines you take and that have been prescribed to you. Take any new Medicines after you have completely understood and accept all the possible adverse reactions/side effects.   Please note  You were cared for by a hospitalist during your hospital stay. If you have any questions about your discharge medications or the care you received  while you were in the hospital after you are discharged, you can call the unit and asked to speak with the hospitalist on call if the hospitalist that took care of you is not available. Once you are discharged, your primary care physician will handle any further medical issues. Please note that NO REFILLS for any discharge medications will be authorized once you are discharged, as it is imperative that you return to your primary care physician (or establish a relationship with a primary care physician if you do not have one) for your aftercare needs so that they can reassess your need for medications and monitor your lab values.    Today   CHIEF COMPLAINT:  No chief complaint on file.   HISTORY OF PRESENT ILLNESS:  Emmanuel Ercole  is a 68 y.o. female with a known history of arthritis, seizure disorder, fibrocystic breast disease.  The patient presents the ED with above chief complaints.  The patient got shingle vaccine 3 weeks ago.  She developed fever and muscle aching.  She has had poor oral intake and generalized weakness since then.  According to her husband, bolus the patient and her husband got flu.  But the influenza test here is negative.  The patient also complains of intermittent nausea, vomiting and mild diarrhea.  Chest x-ray is unremarkable.  But she is found acute renal failure due to dehydration.  She is treated with IV fluid without improvement in the ED.  Dr. Alphonzo Lemmings requested admission.   VITAL SIGNS:  Blood pressure (!) 144/71, pulse 91, temperature 98.7 F (37.1 C), temperature source Oral, resp. rate 20, height  (1.626 m), weight 81.6 kg, SpO2 100 %.  I/O:    Intake/Output Summary (Last 24 hours) at 05/18/2018 1608 Last data filed at 05/18/2018 0615 Gross per 24 hour  Intake -  Output 2300 ml  Net -2300 ml    PHYSICAL EXAMINATION:   GENERAL:  68 y.o.-year-old patient lying in the bed with no acute distress.  EYES: Pupils equal, round, reactive to light and  accommodation. No scleral icterus. Extraocular muscles intact.  HEENT: Head atraumatic, normocephalic. Oropharynx and nasopharynx clear.  NECK:  Supple, no jugular venous distention. No thyroid enlargement, no tenderness.  LUNGS: Normal breath sounds bilaterally, no wheezing, rales,rhonchi or crepitation. No use of accessory muscles of respiration.  CARDIOVASCULAR: S1, S2 normal. No murmurs, rubs, or gallops.  ABDOMEN: Soft, nontender, nondistended. Bowel sounds present. No organomegaly or mass.  EXTREMITIES: No pedal edema, cyanosis, or clubbing.  NEUROLOGIC: Cranial nerves II through XII are intact. Muscle strength 4/5 in all extremities. Sensation intact. Gait not checked.  PSYCHIATRIC: The patient is alert and oriented x 3.  SKIN: No obvious rash, lesion, or ulcer.   DATA REVIEW:   CBC Recent Labs  Lab 05/17/18 0505  WBC 10.1  HGB 10.1*  HCT 30.8*  PLT 219    Chemistries  Recent Labs  Lab 05/18/18 1214  NA 137  K 4.4  CL 107  CO2 22  GLUCOSE 121*  BUN 30*  CREATININE 1.93*  CALCIUM 8.6*    Cardiac Enzymes Recent Labs  Lab 05/16/18 0815  TROPONINI <0.03    Microbiology Results  Results for orders placed or performed during the hospital encounter of 05/16/18  Respiratory Panel by PCR     Status: Abnormal   Collection Time: 05/16/18 10:40 AM  Result Value Ref Range Status   Adenovirus NOT DETECTED NOT DETECTED Final   Coronavirus 229E NOT DETECTED NOT DETECTED Final    Comment: (NOTE) The Coronavirus on the Respiratory Panel, DOES NOT test for the novel  Coronavirus (2019 nCoV)    Coronavirus HKU1 NOT DETECTED NOT DETECTED Final   Coronavirus NL63 NOT DETECTED NOT DETECTED Final   Coronavirus OC43 NOT DETECTED NOT DETECTED Final   Metapneumovirus NOT DETECTED NOT DETECTED Final   Rhinovirus / Enterovirus NOT DETECTED NOT DETECTED Final   Influenza A H1 2009 DETECTED (A) NOT DETECTED Final   Influenza B NOT DETECTED NOT DETECTED Final   Parainfluenza  Virus 1 NOT DETECTED NOT DETECTED Final   Parainfluenza Virus 2 NOT DETECTED NOT DETECTED Final   Parainfluenza Virus 3 NOT DETECTED NOT DETECTED Final   Parainfluenza Virus 4 NOT DETECTED NOT DETECTED Final   Respiratory Syncytial Virus NOT DETECTED NOT DETECTED Final   Bordetella pertussis NOT DETECTED NOT DETECTED Final   Chlamydophila pneumoniae NOT DETECTED NOT DETECTED Final   Mycoplasma pneumoniae NOT DETECTED NOT DETECTED Final    Comment: Performed at [facility], [address] [place], [place] [address]  Urine culture     Status: Abnormal   Collection Time: 05/16/18  1:25 PM  Result Value Ref Range Status   Specimen Description   Final    URINE, CLEAN CATCH Performed at [facility], [address] [place], [place] [address]    Special Requests   Final    NONE Performed at [facility], [address] [place], [place] [address]    Culture >=100,000 COLONIES/mL ESCHERICHIA COLI (A)  Final   Report Status 05/18/2018 FINAL  Final   Organism ID, Bacteria ESCHERICHIA COLI (A)  Final      Susceptibility   Escherichia coli - MIC*    AMPICILLIN <=2 SENSITIVE Sensitive     CEFAZOLIN <=4 SENSITIVE Sensitive     CEFTRIAXONE <=1 SENSITIVE Sensitive     CIPROFLOXACIN <=0.25 SENSITIVE Sensitive     GENTAMICIN <=1 SENSITIVE Sensitive     IMIPENEM <=0.25 SENSITIVE Sensitive     NITROFURANTOIN <=16 SENSITIVE Sensitive     TRIMETH/SULFA <=20 SENSITIVE Sensitive     AMPICILLIN/SULBACTAM <=2 SENSITIVE Sensitive     PIP/TAZO <=4 SENSITIVE Sensitive     Extended ESBL NEGATIVE Sensitive     * >=100,000 COLONIES/mL ESCHERICHIA COLI    RADIOLOGY:  No results found.  EKG:   Orders placed or performed during the hospital encounter of 05/16/18  . ED EKG  . ED EKG      Management plans discussed with the patient, family and they are in agreement.  CODE STATUS: Full    Code Status Orders  (From admission, onward)         Start     Ordered    05/16/18 1540  Full code  Continuous     05/16/18 1539        Code Status History    This patient has a current code status but no  historical code status.      TOTAL TIME TAKING CARE OF THIS PATIENT: 35 minutes.    Altamese Dilling M.D on 05/18/2018 at 4:08 PM  Between 7am to 6pm - Pager - [phone]  After 6pm go to [email or web address] - password [email or web address]  [facility]  Office  [phone]  CC: Primary care physician; Allegra Grana, FNP   Note: This dictation was prepared with Dragon dictation along with smaller phrase technology. Any transcriptional errors that result from this process are unintentional.

## 2018-05-18 NOTE — Discharge Instructions (Signed)
Need to check kidney function ijn 1 week with your PMD.

## 2018-05-18 NOTE — Progress Notes (Signed)
05/18/2018 5:03 PM  Samantha Ellison to be D/C'd Home per MD order.  Discussed prescriptions and follow up appointments with the patient. Prescriptions given to patient, medication list explained in detail. Pt verbalized understanding.  Allergies as of 05/18/2018      Reactions   Compazine [prochlorperazine Edisylate] Other (See Comments)   Neck drew back per patient report. Muscle Tightness/spasms      Medication List    TAKE these medications   Calcium 600-200 MG-UNIT tablet Take 1 tablet by mouth daily.   ciprofloxacin 250 MG tablet Commonly known as:  CIPRO Take 1 tablet (250 mg total) by mouth daily with breakfast for 3 days.   levETIRAcetam 500 MG tablet Commonly known as:  KEPPRA Take 1 tablet (500 mg total) by mouth 2 (two) times daily.   Melatonin 3 MG Tabs Take 1 tablet by mouth as needed.   minoxidil 2.5 MG tablet Commonly known as:  LONITEN Take 2.5 mg by mouth daily. Take 1/2 tablet daily   omega-3 acid ethyl esters 1 g capsule Commonly known as:  LOVAZA Take 2 capsules (2 g total) by mouth 2 (two) times daily.   ondansetron 4 MG disintegrating tablet Commonly known as:  ZOFRAN ODT Take 1 tablet (4 mg total) by mouth every 8 (eight) hours as needed for nausea or vomiting.   oseltamivir 30 MG capsule Commonly known as:  TAMIFLU Take 1 capsule (30 mg total) by mouth daily for 4 days.   pravastatin 20 MG tablet Commonly known as:  PRAVACHOL TAKE 1 TABLET BY MOUTH EVERY DAY   PROBIOTIC-10 Caps Take by mouth daily.   vitamin B-12 1000 MCG tablet Commonly known as:  CYANOCOBALAMIN Take 1,000 mcg by mouth daily.   VITAMIN D-1000 MAX ST 25 MCG (1000 UT) tablet Generic drug:  Cholecalciferol Take by mouth.       Vitals:   05/18/18 1238 05/18/18 1412  BP: (!) 150/71 (!) 144/71  Pulse: 81 91  Resp:    Temp: 99.5 F (37.5 C) 98.7 F (37.1 C)  SpO2: 99% 100%    Skin clean, dry and intact without evidence of skin break down, no evidence of skin  tears noted. IV catheter discontinued intact. Site without signs and symptoms of complications. Dressing and pressure applied. Pt denies pain at this time. No complaints noted.  An After Visit Summary was printed and given to the patient. Patient escorted via WC, and D/C home via private auto.  Bradly Chris

## 2018-05-18 NOTE — Progress Notes (Signed)
[employer] - [place] at [facility]   PATIENT NAME: Samantha Ellison    MR#:  [id number]  DATE OF BIRTH:  24-Sep-1950  SUBJECTIVE:  CHIEF COMPLAINT:  No chief complaint on file. Feeling slightly better today.  She is here with complaint of some cough and respiratory issues for last few days.  REVIEW OF SYSTEMS:  CONSTITUTIONAL: No fever, positive for fatigue or weakness.  EYES: No blurred or double vision.  EARS, NOSE, AND THROAT: No tinnitus or ear pain.  RESPIRATORY: No cough, shortness of breath, wheezing or hemoptysis.  CARDIOVASCULAR: No chest pain, orthopnea, edema.  GASTROINTESTINAL: No nausea, vomiting, diarrhea or abdominal pain.  GENITOURINARY: No dysuria, hematuria.  ENDOCRINE: No polyuria, nocturia,  HEMATOLOGY: No anemia, easy bruising or bleeding SKIN: No rash or lesion. MUSCULOSKELETAL: No joint pain or arthritis.   NEUROLOGIC: No tingling, numbness, weakness.  PSYCHIATRY: No anxiety or depression.   ROS  DRUG ALLERGIES:   Allergies  Allergen Reactions  . Compazine [Prochlorperazine Edisylate] Other (See Comments)    Neck drew back per patient report. Muscle Tightness/spasms    VITALS:  Blood pressure (!) 144/71, pulse 91, temperature 98.7 F (37.1 C), temperature source Oral, resp. rate 20, height 5\' 4"  (1.626 m), weight 81.6 kg, SpO2 100 %.  PHYSICAL EXAMINATION:  GENERAL:  68 y.o.-year-old patient lying in the bed with no acute distress.  EYES: Pupils equal, round, reactive to light and accommodation. No scleral icterus. Extraocular muscles intact.  HEENT: Head atraumatic, normocephalic. Oropharynx and nasopharynx clear.  NECK:  Supple, no jugular venous distention. No thyroid enlargement, no tenderness.  LUNGS: Normal breath sounds bilaterally, no wheezing, rales,rhonchi or crepitation. No use of accessory muscles of respiration.  CARDIOVASCULAR: S1, S2 normal. No murmurs, rubs, or gallops.  ABDOMEN: Soft, nontender, nondistended. Bowel  sounds present. No organomegaly or mass.  EXTREMITIES: No pedal edema, cyanosis, or clubbing.  NEUROLOGIC: Cranial nerves II through XII are intact. Muscle strength 4/5 in all extremities. Sensation intact. Gait not checked.  PSYCHIATRIC: The patient is alert and oriented x 3.  SKIN: No obvious rash, lesion, or ulcer.   Physical Exam LABORATORY PANEL:   CBC Recent Labs  Lab 05/17/18 0505  WBC 10.1  HGB 10.1*  HCT 30.8*  PLT 219   ------------------------------------------------------------------------------------------------------------------  Chemistries  Recent Labs  Lab 05/18/18 1214  NA 137  K 4.4  CL 107  CO2 22  GLUCOSE 121*  BUN 30*  CREATININE 1.93*  CALCIUM 8.6*   ------------------------------------------------------------------------------------------------------------------  Cardiac Enzymes Recent Labs  Lab 05/16/18 0815  TROPONINI <0.03   ------------------------------------------------------------------------------------------------------------------  RADIOLOGY:  No results found.  ASSESSMENT AND PLAN:   Active Problems:   ARF (acute renal failure) (HCC)  * Acute renal failure due to dehydration. normal saline IV, follow-up BMP.  Encourage oral intake.  * Hyponatremia.  Due to dehydration.  IV fluid support. * URI.  Supportive care and Robitussin as needed.  Respiratory panel. * Leukocytosis, possible due to reaction.  Follow-up CBC.   All the records are reviewed and case discussed with Care Management/Social Workerr. Management plans discussed with the patient, family and they are in agreement.  CODE STATUS: Full.  TOTAL TIME TAKING CARE OF THIS PATIENT: 35 minutes.     POSSIBLE D/C IN 1-2 DAYS, DEPENDING ON CLINICAL CONDITION.   Altamese Dilling M.D on 05/18/2018   Between 7am to 6pm - Pager - [phone]  After 6pm go to [email or web address] - password [email or web address]  [employer]  [phone]  CC: Primary care physician; Allegra Grana, FNP  Note: This dictation was prepared with Dragon dictation along with smaller phrase technology. Any transcriptional errors that result from this process are unintentional.

## 2018-05-19 ENCOUNTER — Telehealth: Payer: Self-pay | Admitting: Family

## 2018-05-19 NOTE — Telephone Encounter (Signed)
First attempt completed for TCM call no answer left voicemail asking patient to return call to office.

## 2018-05-26 ENCOUNTER — Ambulatory Visit: Payer: Medicare Other | Admitting: Family

## 2018-05-26 ENCOUNTER — Encounter: Payer: Self-pay | Admitting: Family

## 2018-05-26 VITALS — BP 118/64 | HR 77 | Temp 97.8°F | Wt 177.2 lb

## 2018-05-26 DIAGNOSIS — N179 Acute kidney failure, unspecified: Secondary | ICD-10-CM | POA: Diagnosis not present

## 2018-05-26 LAB — COMPREHENSIVE METABOLIC PANEL
ALK PHOS: 129 U/L — AB (ref 39–117)
ALT: 17 U/L (ref 0–35)
AST: 12 U/L (ref 0–37)
Albumin: 3.8 g/dL (ref 3.5–5.2)
BUN: 24 mg/dL — ABNORMAL HIGH (ref 6–23)
CO2: 26 mEq/L (ref 19–32)
Calcium: 10.2 mg/dL (ref 8.4–10.5)
Chloride: 102 mEq/L (ref 96–112)
Creatinine, Ser: 1.42 mg/dL — ABNORMAL HIGH (ref 0.40–1.20)
GFR: 36.58 mL/min — ABNORMAL LOW (ref >60.00)
GLUCOSE: 86 mg/dL (ref 70–99)
Potassium: 4.6 mEq/L (ref 3.5–5.1)
Sodium: 139 mEq/L (ref 135–145)
Total Bilirubin: 0.6 mg/dL (ref 0.2–1.2)
Total Protein: 7.4 g/dL (ref 6.0–8.3)

## 2018-05-26 LAB — URINALYSIS, ROUTINE W REFLEX MICROSCOPIC
Bilirubin Urine: NEGATIVE
Hgb urine dipstick: NEGATIVE
Ketones, ur: NEGATIVE
Leukocytes,Ua: NEGATIVE
Nitrite: NEGATIVE
RBC / HPF: NONE SEEN (ref 0–?)
Specific Gravity, Urine: <=1.005 — AB (ref 1.000–1.030)
Total Protein, Urine: NEGATIVE
Urine Glucose: NEGATIVE
Urobilinogen, UA: 0.2 (ref 0.0–1.0)
pH: 6 (ref 5.0–8.0)

## 2018-05-26 LAB — CBC WITH DIFFERENTIAL/PLATELET
Basophils Absolute: 0.1 10*3/uL (ref 0.0–0.1)
Basophils Relative: 0.5 % (ref 0.0–3.0)
Eosinophils Absolute: 0.1 10*3/uL (ref 0.0–0.7)
Eosinophils Relative: 0.7 % (ref 0.0–5.0)
HCT: 34.7 % — ABNORMAL LOW (ref 36.0–46.0)
Hemoglobin: 11.5 g/dL — ABNORMAL LOW (ref 12.0–15.0)
Lymphocytes Relative: 14.4 % (ref 12.0–46.0)
Lymphs Abs: 1.6 10*3/uL (ref 0.7–4.0)
MCHC: 33.1 g/dL (ref 30.0–36.0)
MCV: 90.5 fl (ref 78.0–100.0)
MONOS PCT: 8 % (ref 3.0–12.0)
Monocytes Absolute: 0.9 10*3/uL (ref 0.1–1.0)
Neutro Abs: 8.5 10*3/uL — ABNORMAL HIGH (ref 1.4–7.7)
Neutrophils Relative %: 76.4 % (ref 43.0–77.0)
Platelets: 477 10*3/uL — ABNORMAL HIGH (ref 150.0–400.0)
RBC: 3.83 Mil/uL — AB (ref 3.87–5.11)
RDW: 13.3 % (ref 11.5–15.5)
WBC: 11.1 10*3/uL — ABNORMAL HIGH (ref 4.0–10.5)

## 2018-05-26 NOTE — Progress Notes (Signed)
Subjective:    Patient ID: Samantha Ellison, female    DOB: 05-26-1950, 68 y.o.   MRN: [id number]  CC: Samantha Ellison is a 68 y.o. female who presents today for follow up.   HPI: Continues to have dry cough, which has improved.  No fever, myalgia, sinus pain, sob, wheezing.   Drinking well. Appetite is good. Still feels weak, however notes improving.   Would like to have kidney function and urine today  Seizures- follows with Dr Samantha Ellison    Seen in our clinic 05/13/18 Admitted for AKI, influenza, UTI on 06/14/2018 and discharged 3 days later on March 1. CXR 05/16/18- no active disease.   Acute renal failure due to dehydration.  Treated with Tamiflu 30 mg daily according to her renal function.  Given ciprofloxacin at discharge.  Hyponatremia.  HISTORY:  Past Medical History:  Diagnosis Date  . Arthritis   . Arthritis of right knee   . Bilateral ankle fractures   . Fibrocystic breast disease   . History of chicken pox   . Seizures (HCC)    Past Surgical History:  Procedure Laterality Date  . DILATION AND CURETTAGE OF UTERUS    . VAGINAL DELIVERY     Family History  Problem Relation Age of Onset  . Hyperlipidemia Mother   . Hypertension Mother   . COPD Mother   . Aneurysm Mother   . Hypertension Father   . COPD Father   . Depression Daughter   . Heart disease Brother        CAD s/p stents    Allergies: Compazine [prochlorperazine edisylate] Current Outpatient Medications on File Prior to Visit  Medication Sig Dispense Refill  . Calcium 600-200 MG-UNIT tablet Take 1 tablet by mouth daily.    . Cholecalciferol (VITAMIN D-1000 MAX ST) 1000 units tablet Take by mouth.    . levETIRAcetam (KEPPRA) 500 MG tablet Take 1 tablet (500 mg total) by mouth 2 (two) times daily. 60 tablet 1  . Melatonin 3 MG TABS Take 1 tablet by mouth as needed.    . minoxidil (LONITEN) 2.5 MG tablet Take 2.5 mg by mouth daily. Take 1/2 tablet daily    . omega-3 acid ethyl esters (LOVAZA) 1 g  capsule Take 2 capsules (2 g total) by mouth 2 (two) times daily. 120 capsule 3  . ondansetron (ZOFRAN ODT) 4 MG disintegrating tablet Take 1 tablet (4 mg total) by mouth every 8 (eight) hours as needed for nausea or vomiting. 20 tablet 0  . pravastatin (PRAVACHOL) 20 MG tablet TAKE 1 TABLET BY MOUTH EVERY DAY 90 tablet 0  . Probiotic Product (PROBIOTIC-10) CAPS Take by mouth daily.    . vitamin B-12 (CYANOCOBALAMIN) 1000 MCG tablet Take 1,000 mcg by mouth daily.      No current facility-administered medications on file prior to visit.     Social History   Tobacco Use  . Smoking status: Never Smoker  . Smokeless tobacco: Never Used  Substance Use Topics  . Alcohol use: No  . Drug use: No    Review of Systems  Constitutional: Negative for chills and fever.  HENT: Negative for congestion, rhinorrhea and sinus pain.   Respiratory: Positive for cough. Negative for shortness of breath and wheezing.   Cardiovascular: Negative for chest pain, palpitations and leg swelling.  Gastrointestinal: Negative for nausea and vomiting.      Objective:    BP 118/64 (BP Location: Left Arm, Patient Position: Sitting, Cuff Size: Large)  Pulse 77   Temp 97.8 F (36.6 C)   Wt 177 lb 3.2 oz (80.4 kg)   LMP  (LMP Unknown)   SpO2 98%   BMI 30.42 kg/m  BP Readings from Last 3 Encounters:  05/26/18 118/64  05/18/18 (!) 144/71  05/13/18 90/60   Wt Readings from Last 3 Encounters:  05/26/18 177 lb 3.2 oz (80.4 kg)  05/16/18 180 lb (81.6 kg)  05/13/18 180 lb 12.8 oz (82 kg)    Physical Exam Vitals signs reviewed.  Constitutional:      Appearance: She is well-developed.  HENT:     Head: Normocephalic and atraumatic.     Right Ear: Hearing, tympanic membrane, ear canal and external ear normal. No decreased hearing noted. No drainage, swelling or tenderness. No middle ear effusion. No foreign body. Tympanic membrane is not erythematous or bulging.     Left Ear: Hearing, tympanic membrane, ear  canal and external ear normal. No decreased hearing noted. No drainage, swelling or tenderness.  No middle ear effusion. No foreign body. Tympanic membrane is not erythematous or bulging.     Nose: Nose normal. No rhinorrhea.     Right Sinus: No maxillary sinus tenderness or frontal sinus tenderness.     Left Sinus: No maxillary sinus tenderness or frontal sinus tenderness.     Mouth/Throat:     Pharynx: Uvula midline. No oropharyngeal exudate or posterior oropharyngeal erythema.     Tonsils: No tonsillar abscesses.  Eyes:     Conjunctiva/sclera: Conjunctivae normal.  Cardiovascular:     Rate and Rhythm: Regular rhythm.     Pulses: Normal pulses.     Heart sounds: Normal heart sounds.  Pulmonary:     Effort: Pulmonary effort is normal.     Breath sounds: Normal breath sounds. No wheezing, rhonchi or rales.  Lymphadenopathy:     Head:     Right side of head: No submental, submandibular, tonsillar, preauricular, posterior auricular or occipital adenopathy.     Left side of head: No submental, submandibular, tonsillar, preauricular, posterior auricular or occipital adenopathy.     Cervical: No cervical adenopathy.  Skin:    General: Skin is warm and dry.  Neurological:     Mental Status: She is alert.  Psychiatric:        Speech: Speech normal.        Behavior: Behavior normal.        Thought Content: Thought content normal.        Assessment & Plan:   1. AKI (acute kidney injury) [facility]) Reviewed hospitalization course with patient today.  Overall she is feeling better.  Cough is improving.  She will continue to monitor intake and let me know if this does not continue in this direction.  Repeat lab work, urine studies today.  - CBC with Differential/Platelet - Comprehensive metabolic panel - Urinalysis, Routine w reflex microscopic   I am having [name] maintain her levETIRAcetam, Calcium, Cholecalciferol, vitamin B-12, omega-3 acid ethyl esters, Probiotic-10,  pravastatin, Melatonin, minoxidil, and ondansetron.   No orders of the defined types were placed in this encounter.   Return precautions given.   Risks, benefits, and alternatives of the medications and treatment plan prescribed today were discussed, and patient expressed understanding.   Education regarding symptom management and diagnosis given to patient on AVS.  Continue to follow with Allegra Grana, FNP for routine health maintenance.   [name] and I agreed with plan.   Rennie Plowman, FNP

## 2018-05-26 NOTE — Patient Instructions (Signed)
Take care of yourself  Let me know if you do not continuing to find this cough to improve.

## 2018-05-27 ENCOUNTER — Telehealth: Payer: Self-pay | Admitting: Family

## 2018-05-27 NOTE — Telephone Encounter (Signed)
Copied from CRM [id number]. Topic: Quick Communication - See Telephone Encounter >> May 27, 2018  4:37 PM Terisa Starr wrote: CRM for notification. See Telephone encounter for: 05/27/18.   Pt is calling to get her lab results.

## 2018-05-30 ENCOUNTER — Telehealth: Payer: Self-pay

## 2018-05-30 ENCOUNTER — Other Ambulatory Visit: Payer: Self-pay | Admitting: Family

## 2018-05-30 DIAGNOSIS — N179 Acute kidney failure, unspecified: Secondary | ICD-10-CM

## 2018-05-30 NOTE — Telephone Encounter (Signed)
Labs posted to [employer]

## 2018-05-30 NOTE — Telephone Encounter (Signed)
Labs posted to mychart 

## 2018-05-30 NOTE — Telephone Encounter (Signed)
Copied from CRM [id number]. Topic: Quick Communication - See Telephone Encounter >> May 27, 2018  4:37 PM Terisa Starr wrote: CRM for notification. See Telephone encounter for: 05/27/18.   Pt is calling to get her lab results. >> May 30, 2018 12:17 PM Iona Coach, Christen Butter wrote: Pt husband called wanting to know why they had not gotten lab results back yet. Pt asked for supervisor to call him.

## 2018-06-13 ENCOUNTER — Other Ambulatory Visit: Payer: Medicare Other

## 2018-06-24 ENCOUNTER — Telehealth: Payer: Self-pay

## 2018-06-24 NOTE — Telephone Encounter (Signed)
Copied from CRM [id number]. Topic: Quick Communication - See Telephone Encounter >> Jun 24, 2018  9:22 AM Aretta Nip wrote: CRM for notification. See Telephone encounter for: 06/24/18.PT called concerning her appt on 06/30/2018 . She does not want to come into the office and is open to pushing it out or if Claris Che wants to do a telephone visit she is willing to do that. Please fu at either [phone] or [phone]

## 2018-06-25 ENCOUNTER — Other Ambulatory Visit: Payer: Self-pay | Admitting: Family

## 2018-06-30 ENCOUNTER — Other Ambulatory Visit: Payer: Self-pay

## 2018-06-30 ENCOUNTER — Encounter: Payer: Self-pay | Admitting: Family

## 2018-06-30 ENCOUNTER — Ambulatory Visit (INDEPENDENT_AMBULATORY_CARE_PROVIDER_SITE_OTHER): Payer: Medicare Other | Admitting: Family

## 2018-06-30 DIAGNOSIS — Z1239 Encounter for other screening for malignant neoplasm of breast: Secondary | ICD-10-CM

## 2018-06-30 DIAGNOSIS — E785 Hyperlipidemia, unspecified: Secondary | ICD-10-CM | POA: Diagnosis not present

## 2018-06-30 DIAGNOSIS — Z1211 Encounter for screening for malignant neoplasm of colon: Secondary | ICD-10-CM | POA: Diagnosis not present

## 2018-06-30 NOTE — Assessment & Plan Note (Signed)
Doing well on pravastatin, pending lipid panel at this time.

## 2018-06-30 NOTE — Progress Notes (Signed)
This visit type was conducted due to national recommendations for restrictions regarding the COVID-19 pandemic (e.g. social distancing).  This format is felt to be most appropriate for this patient at this time.  All issues noted in this document were discussed and addressed.  No physical exam was performed (except for noted visual exam findings with Video Visits).  Interactive audio and video telecommunications were attempted between this provider and patient, however failed, due to patient having technical difficulties or patient did not have access to video capability.  Patient I can see each other very well initially however no audio, therefore limited exam per below.  We continued and completed visit with audio only.   Virtual Visit via Video Note  I connected with@  on 06/30/18 at  9:00 AM EDT by a video enabled telemedicine application and verified that I am speaking with the correct person using two identifiers.  Location patient: home Location provider:work  Persons participating in the virtual visit: patient, provider  I discussed the limitations of evaluation and management by telemedicine and the availability of in person appointments. The patient expressed understanding and agreed to proceed.   HPI:  Feels well today. No complaints.  Cough has resolved.  No changes to medications.  HLD- compliant with medication.  Tolerating well.  On pravachol 20mg .   History of seizures-follows Dr. Alvira PhilipsKonz; on Keppra.  History of AKI. Last Crt 1.42  H/o Elevated alkaline phosphatase  Due for follow-up for following CBC is elevated, anemic in March of this year  ROS: See pertinent positives and negatives per HPI.  Past Medical History:  Diagnosis Date  . Arthritis   . Arthritis of right knee   . Bilateral ankle fractures   . Fibrocystic breast disease   . History of chicken pox   . Seizures (HCC)     Past Surgical History:  Procedure Laterality Date  . DILATION AND CURETTAGE OF  UTERUS    . VAGINAL DELIVERY      Family History  Problem Relation Age of Onset  . Hyperlipidemia Mother   . Hypertension Mother   . COPD Mother   . Aneurysm Mother   . Hypertension Father   . COPD Father   . Depression Daughter   . Heart disease Brother        CAD s/p stents    SOCIAL HX: Never smoker   Current Outpatient Medications:  .  Calcium 600-200 MG-UNIT tablet, Take 1 tablet by mouth daily., Disp: , Rfl:  .  Cholecalciferol (VITAMIN D-1000 MAX ST) 1000 units tablet, Take by mouth., Disp: , Rfl:  .  levETIRAcetam (KEPPRA) 500 MG tablet, Take 1 tablet (500 mg total) by mouth 2 (two) times daily., Disp: 60 tablet, Rfl: 1 .  Melatonin 3 MG TABS, Take 1 tablet by mouth as needed., Disp: , Rfl:  .  minoxidil (LONITEN) 2.5 MG tablet, Take 2.5 mg by mouth daily. Take 1/2 tablet daily, Disp: , Rfl:  .  omega-3 acid ethyl esters (LOVAZA) 1 g capsule, Take 2 capsules (2 g total) by mouth 2 (two) times daily., Disp: 120 capsule, Rfl: 3 .  ondansetron (ZOFRAN ODT) 4 MG disintegrating tablet, Take 1 tablet (4 mg total) by mouth every 8 (eight) hours as needed for nausea or vomiting., Disp: 20 tablet, Rfl: 0 .  pravastatin (PRAVACHOL) 20 MG tablet, TAKE 1 TABLET BY MOUTH EVERY DAY, Disp: 90 tablet, Rfl: 0 .  Probiotic Product (PROBIOTIC-10) CAPS, Take by mouth daily., Disp: , Rfl:  .  vitamin B-12 (CYANOCOBALAMIN) 1000 MCG tablet, Take 1,000 mcg by mouth daily. , Disp: , Rfl:   EXAM:  VITALS per patient if applicable:  GENERAL: alert, oriented, appears well and in no acute distress  PSYCH/NEURO: pleasant and cooperative, no obvious depression or anxiety, speech and thought processing grossly intact  ASSESSMENT AND PLAN:  Discussed the following assessment and plan:  Hyperlipidemia, unspecified hyperlipidemia type - Plan: Lipid panel  Screen for colon cancer - Plan: Ambulatory referral to Gastroenterology  Screening for breast cancer - Plan: MM 3D SCREEN BREAST  BILATERAL  Problem List Items Addressed This Visit      Other   Screening for breast cancer    Ordered, patient understands schedule.  Of note, repeat labs have been scheduled, phlebotomist will go to patient's car      Relevant Orders   MM 3D SCREEN BREAST BILATERAL   HLD (hyperlipidemia) - Primary    Doing well on pravastatin, pending lipid panel at this time.      Relevant Orders   Lipid panel   Screen for colon cancer    Referral placed      Relevant Orders   Ambulatory referral to Gastroenterology        I discussed the assessment and treatment plan with the patient. The patient was provided an opportunity to ask questions and all were answered. The patient agreed with the plan and demonstrated an understanding of the instructions.   The patient was advised to call back or seek an in-person evaluation if the symptoms worsen or if the condition fails to improve as anticipated.   Rennie Plowman, FNP  I spent 15 min non-face-to-face time.

## 2018-06-30 NOTE — Assessment & Plan Note (Signed)
Referral placed.

## 2018-06-30 NOTE — Patient Instructions (Addendum)
Pleasure speaking with you today.    Please call call and schedule your 3D mammogram, bone density scan as discussed.   [facility]  [address]  [place], [place]  [phone]   Today we discussed referrals, orders. colonoscopy   I have placed these orders in the system for you.  Please be sure to give [place] a call if you have not heard from our office regarding this. We should hear from [place] within ONE week with information regarding your appointment. If not, please let me know immediately.    We look forward to seeing you for labs.

## 2018-06-30 NOTE — Assessment & Plan Note (Signed)
Ordered, patient understands schedule.  Of note, repeat labs have been scheduled, phlebotomist will go to patient's car

## 2018-07-03 ENCOUNTER — Other Ambulatory Visit (INDEPENDENT_AMBULATORY_CARE_PROVIDER_SITE_OTHER): Payer: Medicare Other

## 2018-07-03 ENCOUNTER — Encounter: Payer: Self-pay | Admitting: Family

## 2018-07-03 ENCOUNTER — Other Ambulatory Visit: Payer: Self-pay

## 2018-07-03 DIAGNOSIS — N179 Acute kidney failure, unspecified: Secondary | ICD-10-CM | POA: Diagnosis not present

## 2018-07-03 DIAGNOSIS — E785 Hyperlipidemia, unspecified: Secondary | ICD-10-CM

## 2018-07-03 LAB — COMPREHENSIVE METABOLIC PANEL
ALT: 12 U/L (ref 0–35)
AST: 15 U/L (ref 0–37)
Albumin: 4.4 g/dL (ref 3.5–5.2)
Alkaline Phosphatase: 98 U/L (ref 39–117)
BUN: 23 mg/dL (ref 6–23)
CO2: 28 mEq/L (ref 19–32)
Calcium: 10 mg/dL (ref 8.4–10.5)
Chloride: 103 mEq/L (ref 96–112)
Creatinine, Ser: 1.24 mg/dL — ABNORMAL HIGH (ref 0.40–1.20)
GFR: 42.76 mL/min — ABNORMAL LOW (ref >60.00)
Glucose, Bld: 86 mg/dL (ref 70–99)
Potassium: 4.4 mEq/L (ref 3.5–5.1)
Sodium: 140 mEq/L (ref 135–145)
Total Bilirubin: 1 mg/dL (ref 0.2–1.2)
Total Protein: 7.1 g/dL (ref 6.0–8.3)

## 2018-07-03 LAB — LIPID PANEL
Cholesterol: 168 mg/dL (ref 0–200)
HDL: 47.1 mg/dL (ref >39.00)
LDL Cholesterol: 87 mg/dL (ref 0–99)
NonHDL: 120.6
Total CHOL/HDL Ratio: 4
Triglycerides: 169 mg/dL — ABNORMAL HIGH (ref 0.0–149.0)
VLDL: 33.8 mg/dL (ref 0.0–40.0)

## 2018-07-03 LAB — CBC WITH DIFFERENTIAL/PLATELET
Basophils Absolute: 0 10*3/uL (ref 0.0–0.1)
Basophils Relative: 0.7 % (ref 0.0–3.0)
Eosinophils Absolute: 0.2 10*3/uL (ref 0.0–0.7)
Eosinophils Relative: 3 % (ref 0.0–5.0)
HCT: 38.4 % (ref 36.0–46.0)
Hemoglobin: 12.9 g/dL (ref 12.0–15.0)
Lymphocytes Relative: 27.8 % (ref 12.0–46.0)
Lymphs Abs: 1.4 10*3/uL (ref 0.7–4.0)
MCHC: 33.6 g/dL (ref 30.0–36.0)
MCV: 88.4 fl (ref 78.0–100.0)
Monocytes Absolute: 0.4 10*3/uL (ref 0.1–1.0)
Monocytes Relative: 7.9 % (ref 3.0–12.0)
Neutro Abs: 3.1 10*3/uL (ref 1.4–7.7)
Neutrophils Relative %: 60.6 % (ref 43.0–77.0)
Platelets: 218 10*3/uL (ref 150.0–400.0)
RBC: 4.34 Mil/uL (ref 3.87–5.11)
RDW: 13.9 % (ref 11.5–15.5)
WBC: 5 10*3/uL (ref 4.0–10.5)

## 2018-07-03 LAB — GAMMA GT: GGT: 15 U/L (ref 7–51)

## 2018-07-03 NOTE — Addendum Note (Signed)
Addended by: Warden Fillers on: 07/03/2018 08:14 AM   Modules accepted: Orders

## 2018-07-05 LAB — ALKALINE PHOSPHATASE, ISOENZYMES
Alkaline Phosphatase: 109 IU/L (ref 39–117)
BONE FRACTION: 41 % (ref 14–68)
INTESTINAL FRAC.: 5 % (ref 0–18)
LIVER FRACTION: 54 % (ref 18–85)

## 2018-07-07 ENCOUNTER — Other Ambulatory Visit: Payer: Self-pay

## 2018-07-07 MED ORDER — PRAVASTATIN SODIUM 40 MG PO TABS: 40.0000 mg | ORAL_TABLET | Freq: Every day | ORAL | 3 refills | Status: DC

## 2018-07-08 ENCOUNTER — Encounter: Payer: Self-pay | Admitting: Family

## 2018-09-02 ENCOUNTER — Encounter: Payer: Self-pay | Admitting: Family

## 2018-09-03 ENCOUNTER — Telehealth: Payer: Self-pay

## 2018-09-03 DIAGNOSIS — Z20822 Contact with and (suspected) exposure to covid-19: Secondary | ICD-10-CM

## 2018-09-03 NOTE — Telephone Encounter (Signed)
I spoke with patient in regards to [employer]. I advised patient that this is not a FDA approved test & may not be accurate. Patient still wanted to have test done & I am sending requisition to her in the mail.

## 2018-09-18 ENCOUNTER — Other Ambulatory Visit: Payer: Self-pay | Admitting: Family

## 2018-10-06 ENCOUNTER — Other Ambulatory Visit: Payer: Self-pay

## 2018-10-06 ENCOUNTER — Ambulatory Visit (INDEPENDENT_AMBULATORY_CARE_PROVIDER_SITE_OTHER): Payer: Medicare Other | Admitting: Family

## 2018-10-06 ENCOUNTER — Encounter: Payer: Self-pay | Admitting: Family

## 2018-10-06 DIAGNOSIS — E785 Hyperlipidemia, unspecified: Secondary | ICD-10-CM

## 2018-10-06 DIAGNOSIS — Z13820 Encounter for screening for osteoporosis: Secondary | ICD-10-CM

## 2018-10-06 NOTE — Patient Instructions (Signed)
Fasting labs   Please call call and schedule your bone density scan as discussed at [facility]; let me know of any issues in scheduling   [facility]  [place], [place]     Stay safe!

## 2018-10-06 NOTE — Assessment & Plan Note (Signed)
Pending labs. Continue pravachol for now.

## 2018-10-06 NOTE — Progress Notes (Signed)
Verbal consent for services obtained from patient prior to services given.  Location of call:  provider at work patient at home  Names of all persons present for services: Mable Paris, NP Chief complaint:   Feels well. No complaints. Primary reason for follow up was to follow up on kidney function.   HLD- on increased '40mg'$  pravachol. Eating more plant based diet as well.   Alk phos normalized. Anemia resolved 06/2018  History, background, results pertinent:   Mammogram ( sch 10/2018) and colonoscopy scheduled 10/2018.  Due bone density  A/P/next steps:  Problem List Items Addressed This Visit      Other   HLD (hyperlipidemia) - Primary    Pending labs. Continue pravachol for now.       Relevant Orders   Comprehensive metabolic panel   Lipid panel    Other Visit Diagnoses    Screening for osteoporosis       Relevant Orders   DG Bone Density     Of note: Patient will schedule bone density with mammogram. Order placed.  I spent 15 min  discussing plan of care over the phone.

## 2018-10-07 ENCOUNTER — Encounter: Payer: Self-pay | Admitting: Family

## 2018-10-08 ENCOUNTER — Other Ambulatory Visit: Payer: Medicare Other

## 2018-10-15 ENCOUNTER — Encounter: Payer: Self-pay | Admitting: Family

## 2018-10-15 ENCOUNTER — Other Ambulatory Visit: Payer: Self-pay

## 2018-10-15 ENCOUNTER — Other Ambulatory Visit (INDEPENDENT_AMBULATORY_CARE_PROVIDER_SITE_OTHER): Payer: Medicare Other

## 2018-10-15 DIAGNOSIS — E785 Hyperlipidemia, unspecified: Secondary | ICD-10-CM

## 2018-10-15 LAB — COMPREHENSIVE METABOLIC PANEL
ALT: 13 U/L (ref 0–35)
AST: 17 U/L (ref 0–37)
Albumin: 4.6 g/dL (ref 3.5–5.2)
Alkaline Phosphatase: 100 U/L (ref 39–117)
BUN: 16 mg/dL (ref 6–23)
CO2: 27 mEq/L (ref 19–32)
Calcium: 10 mg/dL (ref 8.4–10.5)
Chloride: 104 mEq/L (ref 96–112)
Creatinine, Ser: 1.16 mg/dL (ref 0.40–1.20)
GFR: 46.14 mL/min — ABNORMAL LOW (ref >60.00)
Glucose, Bld: 83 mg/dL (ref 70–99)
Potassium: 4.3 mEq/L (ref 3.5–5.1)
Sodium: 141 mEq/L (ref 135–145)
Total Bilirubin: 1.1 mg/dL (ref 0.2–1.2)
Total Protein: 6.9 g/dL (ref 6.0–8.3)

## 2018-10-15 LAB — LIPID PANEL
Cholesterol: 185 mg/dL (ref 0–200)
HDL: 48.6 mg/dL (ref >39.00)
LDL Cholesterol: 101 mg/dL — ABNORMAL HIGH (ref 0–99)
NonHDL: 136.37
Total CHOL/HDL Ratio: 4
Triglycerides: 175 mg/dL — ABNORMAL HIGH (ref 0.0–149.0)
VLDL: 35 mg/dL (ref 0.0–40.0)

## 2018-10-20 ENCOUNTER — Telehealth: Payer: Self-pay

## 2018-10-20 DIAGNOSIS — E785 Hyperlipidemia, unspecified: Secondary | ICD-10-CM

## 2018-10-20 NOTE — Telephone Encounter (Signed)
Provided Patient with Lab results voiced understanding. Questions when next blood work will be related to her kidney.  Request that reply would be on mychart. States that she was taking Pravacol only every other day.  Will start taking every day.  Unless U recommend.

## 2018-10-20 NOTE — Telephone Encounter (Signed)
Call pt Yes, take pravachol every day.  We may check CBC in 6-8 weeks with CMP.   Please sch.

## 2018-10-20 NOTE — Addendum Note (Signed)
Addended by: Burnard Hawthorne on: 10/20/2018 04:12 PM   Modules accepted: Orders

## 2018-10-22 NOTE — Telephone Encounter (Signed)
Patient scheduled for 11/14/18

## 2018-11-03 ENCOUNTER — Ambulatory Visit
Admission: RE | Admit: 2018-11-03 | Discharge: 2018-11-03 | Disposition: A | Discharge status: Home / Self Care | Payer: Medicare Other | Source: Ambulatory Visit | Attending: Family | Admitting: Family

## 2018-11-03 ENCOUNTER — Other Ambulatory Visit: Payer: Self-pay

## 2018-11-03 DIAGNOSIS — Z1231 Encounter for screening mammogram for malignant neoplasm of breast: Secondary | ICD-10-CM | POA: Insufficient documentation

## 2018-11-03 DIAGNOSIS — Z1239 Encounter for other screening for malignant neoplasm of breast: Secondary | ICD-10-CM

## 2018-12-12 ENCOUNTER — Other Ambulatory Visit: Payer: Self-pay | Admitting: Family Medicine

## 2018-12-12 ENCOUNTER — Other Ambulatory Visit (INDEPENDENT_AMBULATORY_CARE_PROVIDER_SITE_OTHER): Payer: Medicare Other

## 2018-12-12 ENCOUNTER — Other Ambulatory Visit: Payer: Self-pay

## 2018-12-12 DIAGNOSIS — R17 Unspecified jaundice: Secondary | ICD-10-CM

## 2018-12-12 DIAGNOSIS — E785 Hyperlipidemia, unspecified: Secondary | ICD-10-CM | POA: Diagnosis not present

## 2018-12-12 LAB — COMPREHENSIVE METABOLIC PANEL
ALT: 12 U/L (ref 0–35)
AST: 16 U/L (ref 0–37)
Albumin: 4.4 g/dL (ref 3.5–5.2)
Alkaline Phosphatase: 98 U/L (ref 39–117)
BUN: 13 mg/dL (ref 6–23)
CO2: 26 mEq/L (ref 19–32)
Calcium: 10 mg/dL (ref 8.4–10.5)
Chloride: 105 mEq/L (ref 96–112)
Creatinine, Ser: 1 mg/dL (ref 0.40–1.20)
GFR: 54.74 mL/min — ABNORMAL LOW (ref >60.00)
Glucose, Bld: 87 mg/dL (ref 70–99)
Potassium: 4.7 mEq/L (ref 3.5–5.1)
Sodium: 141 mEq/L (ref 135–145)
Total Bilirubin: 1.4 mg/dL — ABNORMAL HIGH (ref 0.2–1.2)
Total Protein: 6.6 g/dL (ref 6.0–8.3)

## 2018-12-12 LAB — CBC WITH DIFFERENTIAL/PLATELET
Basophils Absolute: 0 10*3/uL (ref 0.0–0.1)
Basophils Relative: 1 % (ref 0.0–3.0)
Eosinophils Absolute: 0.1 10*3/uL (ref 0.0–0.7)
Eosinophils Relative: 2.3 % (ref 0.0–5.0)
HCT: 41.8 % (ref 36.0–46.0)
Hemoglobin: 13.8 g/dL (ref 12.0–15.0)
Lymphocytes Relative: 27.4 % (ref 12.0–46.0)
Lymphs Abs: 1.1 10*3/uL (ref 0.7–4.0)
MCHC: 33 g/dL (ref 30.0–36.0)
MCV: 90.7 fl (ref 78.0–100.0)
Monocytes Absolute: 0.4 10*3/uL (ref 0.1–1.0)
Monocytes Relative: 8.6 % (ref 3.0–12.0)
Neutro Abs: 2.5 10*3/uL (ref 1.4–7.7)
Neutrophils Relative %: 60.7 % (ref 43.0–77.0)
Platelets: 195 10*3/uL (ref 150.0–400.0)
RBC: 4.61 Mil/uL (ref 3.87–5.11)
RDW: 13.3 % (ref 11.5–15.5)
WBC: 4.1 10*3/uL (ref 4.0–10.5)

## 2018-12-15 ENCOUNTER — Other Ambulatory Visit: Payer: Medicare Other

## 2019-01-14 ENCOUNTER — Other Ambulatory Visit (INDEPENDENT_AMBULATORY_CARE_PROVIDER_SITE_OTHER): Payer: Medicare Other

## 2019-01-14 ENCOUNTER — Other Ambulatory Visit: Payer: Self-pay

## 2019-01-14 DIAGNOSIS — R17 Unspecified jaundice: Secondary | ICD-10-CM

## 2019-01-14 LAB — HEPATIC FUNCTION PANEL
ALT: 14 U/L (ref 0–35)
AST: 19 U/L (ref 0–37)
Albumin: 4.8 g/dL (ref 3.5–5.2)
Alkaline Phosphatase: 105 U/L (ref 39–117)
Bilirubin, Direct: 0.2 mg/dL (ref 0.0–0.3)
Total Bilirubin: 1.1 mg/dL (ref 0.2–1.2)
Total Protein: 7.1 g/dL (ref 6.0–8.3)

## 2019-01-22 ENCOUNTER — Encounter: Payer: Self-pay | Admitting: Family

## 2019-04-27 ENCOUNTER — Ambulatory Visit (INDEPENDENT_AMBULATORY_CARE_PROVIDER_SITE_OTHER): Payer: Medicare PPO

## 2019-04-27 ENCOUNTER — Encounter: Payer: Self-pay | Admitting: Family

## 2019-04-27 ENCOUNTER — Other Ambulatory Visit: Payer: Self-pay

## 2019-04-27 ENCOUNTER — Ambulatory Visit (INDEPENDENT_AMBULATORY_CARE_PROVIDER_SITE_OTHER): Payer: Medicare PPO | Admitting: Family

## 2019-04-27 VITALS — Ht 65.5 in | Wt 172.0 lb

## 2019-04-27 DIAGNOSIS — G40909 Epilepsy, unspecified, not intractable, without status epilepticus: Secondary | ICD-10-CM | POA: Diagnosis not present

## 2019-04-27 DIAGNOSIS — R030 Elevated blood-pressure reading, without diagnosis of hypertension: Secondary | ICD-10-CM

## 2019-04-27 DIAGNOSIS — N179 Acute kidney failure, unspecified: Secondary | ICD-10-CM

## 2019-04-27 DIAGNOSIS — Z0000 Encounter for general adult medical examination without abnormal findings: Secondary | ICD-10-CM | POA: Diagnosis not present

## 2019-04-27 DIAGNOSIS — E785 Hyperlipidemia, unspecified: Secondary | ICD-10-CM | POA: Diagnosis not present

## 2019-04-27 NOTE — Patient Instructions (Addendum)
  Ms. Samantha Ellison , Thank you for taking time to come for your Medicare Wellness Visit. I appreciate your ongoing commitment to your health goals. Please review the following plan we discussed and let me know if I can assist you in the future.   These are the goals we discussed: Goals      Patient Stated   . DIET - Snack Healthy (pt-stated)    . Weight (lb) < 160 lb (72.6 kg) (pt-stated)       This is a list of the screening recommended for you and due dates:  Health Maintenance  Topic Date Due  . Colon Cancer Screening  04/26/2020*  . Mammogram  11/02/2020  . Tetanus Vaccine  02/03/2025  . Flu Shot  Completed  . DEXA scan (bone density measurement)  Completed  .  Hepatitis C: One time screening is recommended by Center for Disease Control  (CDC) for  adults born from [birth year] through 1967.   Completed  . Pneumonia vaccines  Completed  *Topic was postponed. The date shown is not the original due date.

## 2019-04-27 NOTE — Assessment & Plan Note (Addendum)
BP Readings from Last 3 Encounters:  05/26/18 118/64  05/18/18 (!) 144/71  05/13/18 90/60   No recent readings. Advised to check BP at home. She will call with readings.

## 2019-04-27 NOTE — Assessment & Plan Note (Signed)
Dramatic improvement last time renal function assessed.  Pending labs again to see if patient is completely back to baseline

## 2019-04-27 NOTE — Assessment & Plan Note (Signed)
Doing well regimen, will continue.  Pending lipid panel

## 2019-04-27 NOTE — Progress Notes (Signed)
Verbal consent for services obtained from patient prior to services given to TELEPHONE visit:   Location of call:  provider at work patient at home  Names of all persons present for services: Rennie Plowman, NP Chief complaint:  Feels well. No complaints today. Wants to have follow-up labs in regards her kidneys. No decreased urine, hematuria.  HLD- on pravachol, tolerating medication  Hair growth - on minoxidil with dermatology  H/o seizure- keppra- Dr Alvira Philips; last visit 11/2017  History, background, results pertinent:  UTD DEXA UTD MM   A/P/next steps: Problem List Items Addressed This Visit      Nervous and Auditory   Seizure disorder (HCC)    No recent follow-up with Dr. Alvira Philips, neurology.  Advised that she call to make this appointment.  Patient verbalized understanding        Genitourinary   ARF (acute renal failure) (HCC)    Dramatic improvement last time renal function assessed.  Pending labs again to see if patient is completely back to baseline      Relevant Orders   Comprehensive metabolic panel   Microalbumin / creatinine urine ratio   Urinalysis, Routine w reflex microscopic     Other   Elevated blood pressure reading    BP Readings from Last 3 Encounters:  05/26/18 118/64  05/18/18 (!) 144/71  05/13/18 90/60   No recent readings. Advised to check BP at home. She will call with readings.       Relevant Orders   Comprehensive metabolic panel   HLD (hyperlipidemia) - Primary    Doing well regimen, will continue.  Pending lipid panel      Relevant Orders   Lipid panel       I spent 21 min  discussing plan of care over the phone.

## 2019-04-27 NOTE — Assessment & Plan Note (Signed)
No recent follow-up with Dr. Alvira Philips, neurology.  Advised that she call to make this appointment.  Patient verbalized understanding

## 2019-04-27 NOTE — Progress Notes (Addendum)
Subjective:   Samantha Ellison is a 69 y.o. female who presents for Medicare Annual (Subsequent) preventive examination.  Review of Systems:  No ROS.  Medicare Wellness Virtual Visit.  Visual/audio telehealth visit, UTA vital signs.   Ht/Wt provided. See social history for additional risk factors.   Cardiac Risk Factors include: advanced age (>9men, >22 women)     Objective:     Vitals: Ht 5' 5.5" (1.664 m)   Wt 172 lb (78 kg)   LMP  (LMP Unknown)   BMI 28.19 kg/m   Body mass index is 28.19 kg/m.  Advanced Directives 04/27/2019 05/16/2018 05/16/2018 04/24/2018 04/23/2017 04/20/2016 02/27/2016  Does Patient Have a Medical Advance Directive? Yes No No Yes No No Yes  Type of Diplomatic Services operational officer - - Healthcare Power of [place];Living will - - Living will;Healthcare Power of Attorney  Does patient want to make changes to medical advance directive? No - Patient declined - - No - Patient declined Yes (MAU/Ambulatory/Procedural Areas - Information given) - -  Copy of Healthcare Power of Attorney in Chart? No - copy requested - - No - copy requested - - -  Would patient like information on creating a medical advance directive? - Yes (Inpatient - patient requests chaplain consult to create a medical advance directive) No - Patient declined - - Yes (MAU/Ambulatory/Procedural Areas - Information given) -    Tobacco Social History   Tobacco Use  Smoking Status Never Smoker  Smokeless Tobacco Never Used     Counseling given: Not Answered   Clinical Intake:  Pre-visit preparation completed: Yes           How often do you need to have someone help you when you read instructions, pamphlets, or other written materials from your doctor or pharmacy?: 1 - Never  Interpreter Needed?: No     Past Medical History:  Diagnosis Date  . Arthritis   . Arthritis of right knee   . Bilateral ankle fractures   . Fibrocystic breast disease   . History of chicken pox    . Seizures (HCC)    Past Surgical History:  Procedure Laterality Date  . DILATION AND CURETTAGE OF UTERUS    . VAGINAL DELIVERY     Family History  Problem Relation Age of Onset  . Hyperlipidemia Mother   . Hypertension Mother   . COPD Mother   . Aneurysm Mother   . Hypertension Father   . COPD Father   . Depression Daughter   . Heart disease Brother        CAD s/p stents   Social History   Socioeconomic History  . Marital status: Married    Spouse name: Not on file  . Number of children: Not on file  . Years of education: Not on file  . Highest education level: Not on file  Occupational History  . Not on file  Tobacco Use  . Smoking status: Never Smoker  . Smokeless tobacco: Never Used  Substance and Sexual Activity  . Alcohol use: No  . Drug use: No  . Sexual activity: Not Currently  Other Topics Concern  . Not on file  Social History Narrative   Lives in [place] with husband. Has 2 cats.      Work - Retired Adult nurse      Diet - regular      Exercise - [employer] for [employer], walking, limited by hip pain. Yoga twice weekly. Gardening  Enjoy 'pick your own peaches, blueberries, apples.' [place].        Social Determinants of Health   Financial Resource Strain:   . Difficulty of Paying Living Expenses: Not on file  Food Insecurity:   . Worried About Charity fundraiser in the Last Year: Not on file  . Ran Out of Food in the Last Year: Not on file  Transportation Needs:   . Lack of Transportation (Medical): Not on file  . Lack of Transportation (Non-Medical): Not on file  Physical Activity:   . Days of Exercise per Week: Not on file  . Minutes of Exercise per Session: Not on file  Stress:   . Feeling of Stress : Not on file  Social Connections:   . Frequency of Communication with Friends and Family: Not on file  . Frequency of Social Gatherings with Friends and Family: Not on file  . Attends Religious  Services: Not on file  . Active Member of Clubs or Organizations: Not on file  . Attends Archivist Meetings: Not on file  . Marital Status: Not on file    Outpatient Encounter Medications as of 04/27/2019  Medication Sig  . Calcium 600-200 MG-UNIT tablet Take 1 tablet by mouth daily.  . Cholecalciferol (VITAMIN D-1000 MAX ST) 1000 units tablet Take by mouth.  . levETIRAcetam (KEPPRA) 500 MG tablet Take 1 tablet (500 mg total) by mouth 2 (two) times daily.  . Melatonin 3 MG TABS Take 1 tablet by mouth as needed.  . minoxidil (LONITEN) 2.5 MG tablet Take 2.5 mg by mouth daily. Take 1/2 tablet daily  . Multiple Vitamin (MULTI-VITAMIN) tablet Take by mouth.  . pravastatin (PRAVACHOL) 40 MG tablet Take 1 tablet (40 mg total) by mouth daily.  . vitamin B-12 (CYANOCOBALAMIN) 1000 MCG tablet Take 1,000 mcg by mouth daily.   Marland Kitchen zinc gluconate 50 MG tablet Take 50 mg by mouth daily.   No facility-administered encounter medications on file as of 04/27/2019.    Activities of Daily Living In your present state of health, do you have any difficulty performing the following activities: 04/27/2019 05/16/2018  Hearing? Tempie Donning  Vision? N N  Difficulty concentrating or making decisions? N N  Walking or climbing stairs? N N  Dressing or bathing? N N  Doing errands, shopping? N N  Preparing Food and eating ? N -  Using the Toilet? N -  In the past six months, have you accidently leaked urine? N -  Do you have problems with loss of bowel control? N -  Managing your Medications? N -  Managing your Finances? N -  Housekeeping or managing your Housekeeping? N -  Some recent data might be hidden    Patient Care Team: Burnard Hawthorne, FNP as PCP - General (Family Medicine)    Assessment:   This is a routine wellness examination for Samantha Ellison.  Nurse connected with patient 04/27/19 at  9:30 AM EST by a telephone enabled telemedicine application and verified that I am speaking with the correct  person using two identifiers. Patient stated full name and DOB. Patient gave permission to continue with virtual visit. Patient's location was at home and Nurse's location was at [place] office.   Patient is alert and oriented x3. Patient denies difficulty focusing or concentrating. Patient likes to read for brain stimulation.   Health Maintenance Due: -Colonoscopy- plans to schedule later in the season.  -Cologuard- discussed. See completed HM at the end of note.  Eye: Visual acuity not assessed. Virtual visit. Followed by their ophthalmologist.  Dental: UTD.  Hearing: Hearing aids- yes  Safety:  Patient feels safe at home- yes Patient does have smoke detectors at home- yes Patient does wear sunscreen or protective clothing when in direct sunlight - yes Patient does wear seat belt when in a moving vehicle - yes Patient drives- yes Adequate lighting in walkways free from debris- yes Grab bars and handrails used as appropriate- yes Ambulates with an assistive device- no Cell phone on person when ambulating outside of the home- yes  Social: Alcohol intake - no      Smoking history- never   Smokers in home? none Illicit drug use? none  Medication: Taking as directed and without issues.  Self managed - yes   Covid-19: Precautions and sickness symptoms discussed. Wears mask, social distancing, hand hygiene as appropriate.   Activities of Daily Living Patient denies needing assistance with: household chores, feeding themselves, getting from bed to chair, getting to the toilet, bathing/showering, dressing, managing money, or preparing meals.   Discussed the importance of a healthy diet, water intake and the benefits of aerobic exercise. Physical activity- walking or riding stationary bike daily, 45-60 minutes  Diet:  Regular Water: 60-100 ounces daily Caffeine: none  Other Providers Patient Care Team: Allegra Grana, FNP as PCP - General (Family  Medicine)  Exercise Activities and Dietary recommendations Current Exercise Habits: Home exercise routine, Type of exercise: walking;calisthenics, Time (Minutes): 60(stationary bike), Frequency (Times/Week): 7, Weekly Exercise (Minutes/Week): 420, Intensity: Mild  Goals      Patient Stated   . DIET - Snack Healthy (pt-stated)    . Weight (lb) < 160 lb (72.6 kg) (pt-stated)       Fall Risk Fall Risk  04/27/2019 04/27/2019 04/24/2018 04/23/2017 04/20/2016  Falls in the past year? 0 0 0 No No  Follow up Falls evaluation completed - - - -   Timed Get Up and Go performed: no, virtual visit  Depression Screen PHQ 2/9 Scores 04/27/2019 04/27/2019 04/24/2018 04/23/2017  PHQ - 2 Score 0 0 0 0     Cognitive Function MMSE - Mini Mental State Exam 04/23/2017 11/17/2014  Not completed: Refused -  Orientation to time - 5  Orientation to Place - 5  Registration - 3  Attention/ Calculation - 5  Recall - 3  Language- name 2 objects - 2  Language- repeat - 1  Language- follow 3 step command - 3  Language- read & follow direction - 1  Write a sentence - 1  Copy design - 1  Total score - 30     6CIT Screen 04/27/2019 04/24/2018 04/20/2016  What Year? 0 points 0 points 0 points  What month? 0 points 0 points 0 points  What time? 0 points 0 points 0 points  Count back from 20 - 0 points 0 points  Months in reverse - 0 points 0 points  Repeat phrase - 0 points 0 points  Total Score - 0 0    Immunization History  Administered Date(s) Administered  . Fluad Quad(high Dose 65+) 11/19/2018  . Influenza, High Dose Seasonal PF 11/17/2014, 12/27/2017  . Influenza,inj,Quad PF,6+ Mos 11/16/2015  . Influenza-Unspecified 12/27/2012, 01/02/2014, 01/01/2017  . Pneumococcal Conjugate-13 08/27/2013  . Pneumococcal Polysaccharide-23 01/30/2012, 05/31/2017  . Tdap 12/27/2008, 02/04/2015  . Zoster 12/27/2012  . Zoster Recombinat (Shingrix) 04/28/2018, 09/17/2018   Screening Tests Health Maintenance  Topic Date Due  .  COLONOSCOPY  04/26/2020 (Originally 03/19/2018)  .  MAMMOGRAM  11/02/2020  . TETANUS/TDAP  02/03/2025  . INFLUENZA VACCINE  Completed  . DEXA SCAN  Completed  . Hepatitis C Screening  Completed  . PNA vac Low Risk Adult  Completed      Plan:   Keep all routine maintenance appointments.   Follow up with your doctor today at 10:00.  Medicare Attestation I have personally reviewed: The patient's medical and social history Their use of alcohol, tobacco or illicit drugs Their current medications and supplements The patient's functional ability including ADLs,fall risks, home safety risks, cognitive, and hearing and visual impairment Diet and physical activities Evidence for depression   I have reviewed and discussed with patient certain preventive protocols, quality metrics, and best practice recommendations.     Ashok Pall, LPN  08/18/9010    Agree with plan. Rennie Plowman, NP

## 2019-04-30 ENCOUNTER — Other Ambulatory Visit (INDEPENDENT_AMBULATORY_CARE_PROVIDER_SITE_OTHER): Payer: Medicare PPO

## 2019-04-30 ENCOUNTER — Other Ambulatory Visit: Payer: Self-pay

## 2019-04-30 DIAGNOSIS — E785 Hyperlipidemia, unspecified: Secondary | ICD-10-CM

## 2019-04-30 DIAGNOSIS — R030 Elevated blood-pressure reading, without diagnosis of hypertension: Secondary | ICD-10-CM | POA: Diagnosis not present

## 2019-04-30 DIAGNOSIS — N179 Acute kidney failure, unspecified: Secondary | ICD-10-CM | POA: Diagnosis not present

## 2019-04-30 LAB — MICROALBUMIN / CREATININE URINE RATIO
Creatinine,U: 80.1 mg/dL
Microalb Creat Ratio: 0.9 mg/g (ref 0.0–30.0)
Microalb, Ur: 0.7 mg/dL (ref 0.0–1.9)

## 2019-04-30 LAB — COMPREHENSIVE METABOLIC PANEL
ALT: 15 U/L (ref 0–35)
AST: 18 U/L (ref 0–37)
Albumin: 4.4 g/dL (ref 3.5–5.2)
Alkaline Phosphatase: 106 U/L (ref 39–117)
BUN: 22 mg/dL (ref 6–23)
CO2: 27 mEq/L (ref 19–32)
Calcium: 9.5 mg/dL (ref 8.4–10.5)
Chloride: 107 mEq/L (ref 96–112)
Creatinine, Ser: 1.08 mg/dL (ref 0.40–1.20)
GFR: 50.03 mL/min — ABNORMAL LOW (ref >60.00)
Glucose, Bld: 80 mg/dL (ref 70–99)
Potassium: 3.9 mEq/L (ref 3.5–5.1)
Sodium: 141 mEq/L (ref 135–145)
Total Bilirubin: 1 mg/dL (ref 0.2–1.2)
Total Protein: 7.2 g/dL (ref 6.0–8.3)

## 2019-04-30 LAB — URINALYSIS, ROUTINE W REFLEX MICROSCOPIC
Bilirubin Urine: NEGATIVE
Hgb urine dipstick: NEGATIVE
Ketones, ur: NEGATIVE
Leukocytes,Ua: NEGATIVE
Nitrite: NEGATIVE
RBC / HPF: NONE SEEN (ref 0–?)
Specific Gravity, Urine: 1.015 (ref 1.000–1.030)
Total Protein, Urine: NEGATIVE
Urine Glucose: NEGATIVE
Urobilinogen, UA: 0.2 (ref 0.0–1.0)
pH: 5.5 (ref 5.0–8.0)

## 2019-04-30 LAB — LIPID PANEL
Cholesterol: 153 mg/dL (ref 0–200)
HDL: 52.9 mg/dL (ref >39.00)
LDL Cholesterol: 77 mg/dL (ref 0–99)
NonHDL: 99.98
Total CHOL/HDL Ratio: 3
Triglycerides: 116 mg/dL (ref 0.0–149.0)
VLDL: 23.2 mg/dL (ref 0.0–40.0)

## 2019-05-01 ENCOUNTER — Encounter: Payer: Self-pay | Admitting: Family

## 2019-05-15 ENCOUNTER — Encounter: Payer: Self-pay | Admitting: Family

## 2019-05-19 ENCOUNTER — Other Ambulatory Visit: Payer: Self-pay | Admitting: Family

## 2019-05-19 DIAGNOSIS — N179 Acute kidney failure, unspecified: Secondary | ICD-10-CM

## 2019-06-05 ENCOUNTER — Encounter: Payer: Self-pay | Admitting: Family

## 2019-06-05 ENCOUNTER — Other Ambulatory Visit: Payer: Self-pay | Admitting: Family

## 2019-06-09 ENCOUNTER — Encounter: Payer: Self-pay | Admitting: Family

## 2019-10-26 ENCOUNTER — Telehealth: Payer: Self-pay | Admitting: Family

## 2019-10-26 NOTE — Telephone Encounter (Signed)
Rejection Reason - Patient Declined - states she is seeing new PCP and referral is no longer needed" [facility] said about 3 hours ago

## 2020-04-27 ENCOUNTER — Ambulatory Visit: Payer: Medicare PPO
